# Patient Record
Sex: Female | Born: 1935 | Race: White | Hispanic: No | Smoking: Never smoker
Health system: Southern US, Community
[De-identification: ages and names within clinical notes are randomized; demographics above are authoritative.]

## PROBLEM LIST (undated history)

## (undated) DIAGNOSIS — J45909 Unspecified asthma, uncomplicated: Secondary | ICD-10-CM

## (undated) DIAGNOSIS — Z5189 Encounter for other specified aftercare: Secondary | ICD-10-CM

## (undated) DIAGNOSIS — E119 Type 2 diabetes mellitus without complications: Secondary | ICD-10-CM

## (undated) DIAGNOSIS — I10 Essential (primary) hypertension: Secondary | ICD-10-CM

## (undated) DIAGNOSIS — M1990 Unspecified osteoarthritis, unspecified site: Secondary | ICD-10-CM

## (undated) DIAGNOSIS — W19XXXA Unspecified fall, initial encounter: Secondary | ICD-10-CM

## (undated) DIAGNOSIS — C44300 Unspecified malignant neoplasm of skin of unspecified part of face: Secondary | ICD-10-CM

## (undated) DIAGNOSIS — Y92009 Unspecified place in unspecified non-institutional (private) residence as the place of occurrence of the external cause: Secondary | ICD-10-CM

## (undated) DIAGNOSIS — F419 Anxiety disorder, unspecified: Secondary | ICD-10-CM

## (undated) DIAGNOSIS — M109 Gout, unspecified: Secondary | ICD-10-CM

## (undated) HISTORY — PX: TONSILLECTOMY: SUR1361

## (undated) HISTORY — PX: BACK SURGERY: SHX140

## (undated) HISTORY — PX: ABDOMINAL HYSTERECTOMY: SHX81

## (undated) HISTORY — PX: ANKLE SURGERY: SHX546

## (undated) HISTORY — PX: SKIN BIOPSY: SHX1

## (undated) HISTORY — PX: CHOLECYSTECTOMY: SHX55

---

## 2009-02-15 ENCOUNTER — Ambulatory Visit (HOSPITAL_BASED_OUTPATIENT_CLINIC_OR_DEPARTMENT_OTHER): Admission: RE | Admit: 2009-02-15 | Discharge: 2009-02-15 | Payer: Self-pay | Admitting: Orthopedic Surgery

## 2009-09-08 ENCOUNTER — Emergency Department (HOSPITAL_COMMUNITY): Admission: EM | Admit: 2009-09-08 | Discharge: 2009-09-08 | Payer: Self-pay | Admitting: Emergency Medicine

## 2010-08-21 LAB — POCT I-STAT, CHEM 8
Calcium, Ion: 1.14 mmol/L (ref 1.12–1.32)
HCT: 45 % (ref 36.0–46.0)
Potassium: 4.4 mEq/L (ref 3.5–5.1)
TCO2: 29 mmol/L (ref 0–100)

## 2011-04-23 IMAGING — CR DG CHEST 1V PORT
1 series · 1 of 1 positions shown · non-contrast
Comparison: None

CLINICAL DATA: Preoperative respiratory examination.

PORTABLE CHEST - 1 VIEW

[view not recorded]
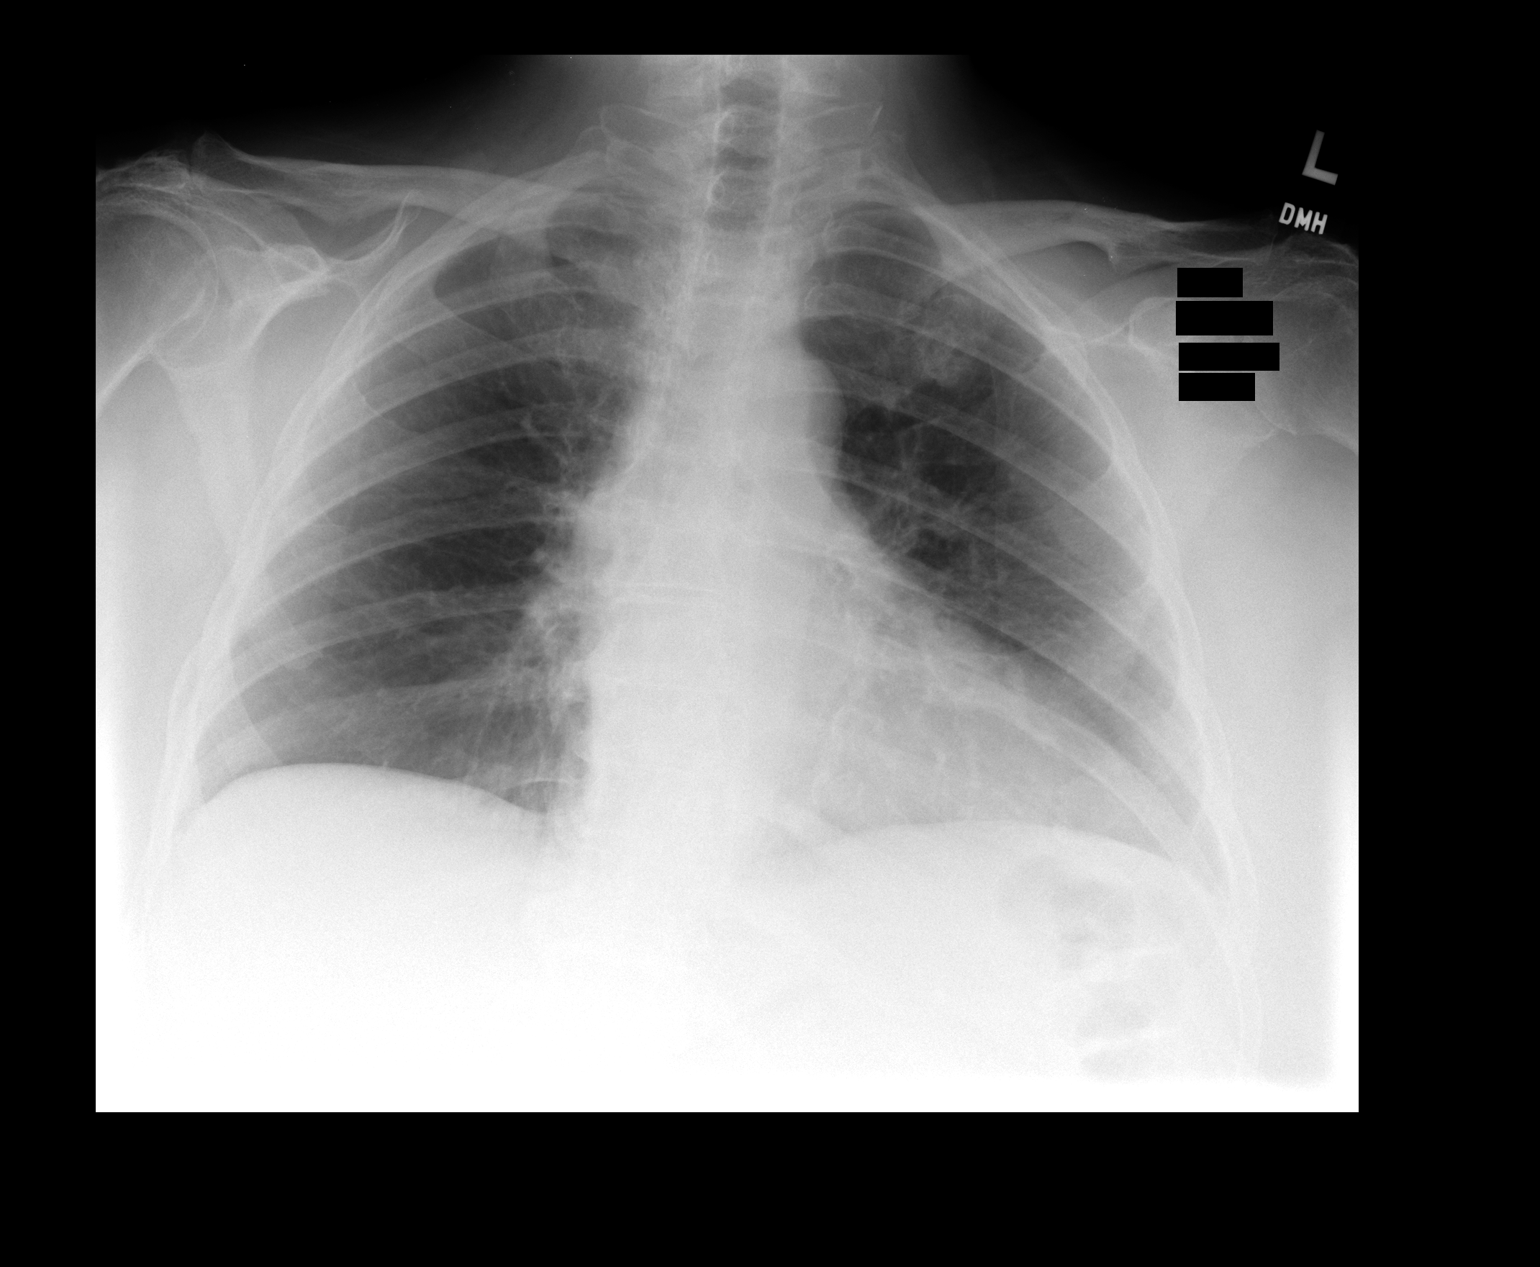

[1 of 1 positions shown; findings below may reference images not displayed]

FINDINGS: The cardiomediastinal silhouette is unremarkable.
The lungs are clear.
There is no evidence of focal airspace disease, pulmonary edema,
pleural effusion, or pneumothorax.
No acute bony abnormalities are identified.
IMPRESSION: No evidence of acute cardiopulmonary disease.

## 2014-10-22 ENCOUNTER — Other Ambulatory Visit: Payer: Self-pay | Admitting: Internal Medicine

## 2014-11-24 ENCOUNTER — Emergency Department (HOSPITAL_BASED_OUTPATIENT_CLINIC_OR_DEPARTMENT_OTHER)
Admission: EM | Admit: 2014-11-24 | Discharge: 2014-11-24 | Disposition: A | Discharge status: Home / Self Care | Payer: Medicare Other | Attending: Emergency Medicine | Admitting: Emergency Medicine

## 2014-11-24 ENCOUNTER — Encounter (HOSPITAL_BASED_OUTPATIENT_CLINIC_OR_DEPARTMENT_OTHER): Payer: Self-pay | Admitting: *Deleted

## 2014-11-24 ENCOUNTER — Emergency Department (HOSPITAL_BASED_OUTPATIENT_CLINIC_OR_DEPARTMENT_OTHER): Payer: Medicare Other

## 2014-11-24 DIAGNOSIS — Z85828 Personal history of other malignant neoplasm of skin: Secondary | ICD-10-CM | POA: Insufficient documentation

## 2014-11-24 DIAGNOSIS — M5412 Radiculopathy, cervical region: Secondary | ICD-10-CM | POA: Diagnosis not present

## 2014-11-24 DIAGNOSIS — I10 Essential (primary) hypertension: Secondary | ICD-10-CM | POA: Diagnosis not present

## 2014-11-24 DIAGNOSIS — Z79899 Other long term (current) drug therapy: Secondary | ICD-10-CM | POA: Diagnosis not present

## 2014-11-24 DIAGNOSIS — M25512 Pain in left shoulder: Secondary | ICD-10-CM | POA: Diagnosis not present

## 2014-11-24 DIAGNOSIS — E119 Type 2 diabetes mellitus without complications: Secondary | ICD-10-CM | POA: Diagnosis not present

## 2014-11-24 DIAGNOSIS — M4802 Spinal stenosis, cervical region: Secondary | ICD-10-CM | POA: Diagnosis not present

## 2014-11-24 DIAGNOSIS — M542 Cervicalgia: Secondary | ICD-10-CM | POA: Diagnosis present

## 2014-11-24 DIAGNOSIS — M109 Gout, unspecified: Secondary | ICD-10-CM | POA: Insufficient documentation

## 2014-11-24 DIAGNOSIS — J45909 Unspecified asthma, uncomplicated: Secondary | ICD-10-CM | POA: Diagnosis not present

## 2014-11-24 HISTORY — DX: Unspecified osteoarthritis, unspecified site: M19.90

## 2014-11-24 HISTORY — DX: Essential (primary) hypertension: I10

## 2014-11-24 HISTORY — DX: Encounter for other specified aftercare: Z51.89

## 2014-11-24 HISTORY — DX: Type 2 diabetes mellitus without complications: E11.9

## 2014-11-24 HISTORY — DX: Gout, unspecified: M10.9

## 2014-11-24 HISTORY — DX: Unspecified asthma, uncomplicated: J45.909

## 2014-11-24 HISTORY — DX: Unspecified malignant neoplasm of skin of unspecified part of face: C44.300

## 2014-11-24 MED ORDER — HYDROCODONE-ACETAMINOPHEN 5-325 MG PO TABS: 1.0000 | ORAL_TABLET | Freq: Four times a day (QID) | ORAL | Status: DC | PRN

## 2014-11-24 MED ORDER — PREDNISONE 10 MG PO TABS: 20.0000 mg | ORAL_TABLET | Freq: Two times a day (BID) | ORAL | Status: DC

## 2014-11-24 NOTE — ED Notes (Signed)
MD at bedside to discuss results of testing. 

## 2014-11-24 NOTE — ED Notes (Signed)
Patient transported to MRI via wheelchair per tech.

## 2014-11-24 NOTE — ED Notes (Signed)
MD at bedside. 

## 2014-11-24 NOTE — ED Provider Notes (Signed)
CSN: [id number]   Arrival date & time 11/24/14 1645  History  This chart was scribed for  Veryl Speak, MD by Altamease Oiler, ED Scribe. This patient was seen in room MH07/MH07 and the patient's care was started at 5:34 PM.  Chief Complaint  Patient presents with  . Shoulder Pain    HPI Patient is a 81 y.o. female presenting with neck injury. The history is provided by the patient and a relative. No language interpreter was used.  Neck Injury This is a new problem. The current episode started more than 2 days ago. The problem occurs constantly. The problem has been gradually worsening. Pertinent negatives include no chest pain, no abdominal pain, no headaches and no shortness of breath. Exacerbated by: laying flat. The symptoms are relieved by heat and medications. She has tried water for the symptoms. The treatment provided moderate relief.   Jo Hayes is a 81 y.o. female with PMHx of arthritis, gout, HTN, and DM who presents to the Emergency Department complaining of constant left-sided neck pain with onset 1 week ago. The pain radiates to the left shoulder, fingertips of the left hand, and back. She rates the pain 10/10 in severity and it is exacerbated by laying flat. The pain is improved with hot water and OTC cream. She associates the pain with a fall 2-3 months ago and notes that she has been seen in the ED and told that she was having muscle spasms.  Associated symptoms include feeling of "something crawling" in the left hand 1 month. Denies increased extremity weakness, numbness, and chest pain.    Past Medical History  Diagnosis Date  . Arthritis   . Blood transfusion without reported diagnosis   . Asthma   . Skin cancer of face   . Hypertension   . Diabetes mellitus without complication   . Gout     Past Surgical History  Procedure Laterality Date  . Abdominal hysterectomy    . Cholecystectomy    . Ankle surgery    . Tonsillectomy    . Skin biopsy      No family history  on file.  History  Substance Use Topics  . Smoking status: Never Smoker   . Smokeless tobacco: Never Used  . Alcohol Use: No     Review of Systems  Respiratory: Negative for shortness of breath.   Cardiovascular: Negative for chest pain.  Gastrointestinal: Negative for abdominal pain.  Musculoskeletal: Positive for neck pain.       Left shoulder pain   Neurological: Negative for headaches.       "Crawling" sensation in the left hand  All other systems reviewed and are negative.   Home Medications   Prior to Admission medications   Medication Sig Start Date End Date Taking? Authorizing Provider  allopurinol (ZYLOPRIM) 100 MG tablet Take 100 mg by mouth daily.   Yes Historical Provider, MD  amLODipine (NORVASC) 5 MG tablet Take 5 mg by mouth daily.   Yes Historical Provider, MD  atenolol (TENORMIN) 50 MG tablet Take 50 mg by mouth 2 (two) times daily.   Yes Historical Provider, MD  Calcium Carbonate-Vitamin D (CALCIUM + D PO) Take by mouth.   Yes Historical Provider, MD  clonazePAM (KLONOPIN) 1 MG tablet Take 1 mg by mouth 3 (three) times daily as needed for anxiety.   Yes Historical Provider, MD  Multiple Vitamin (MULTIVITAMIN) capsule Take 1 capsule by mouth daily.   Yes Historical Provider, MD    Allergies  Avelox; Demerol; Hydrochlorothiazide; and Sulfa antibiotics  Triage Vitals: BP 158/70 mmHg  Pulse 68  Temp(Src) 98.1 F (36.7 C) (Oral)  Resp 18  Ht 4\' 11"  (1.499 m)  Wt 180 lb (81.647 kg)  BMI 36.34 kg/m2  SpO2 96%  Physical Exam  Constitutional: She is oriented to person, place, and time and well-developed, well-nourished, and in no distress. No distress.  HENT:  Head: Normocephalic and atraumatic.  Neck: Normal range of motion. Neck supple.  There is TTP in the soft tissues of the left posterior cervical region.   Neurological: She is alert and oriented to person, place, and time.  The left arm is NVI. Strength is 5/5 throughout the hand. Sensation is intact  through the entire hand. Ulnar and radial pulses are easily palpable.   Skin: Skin is warm. She is not diaphoretic.    ED Course  Procedures   DIAGNOSTIC STUDIES: Oxygen Saturation is 96% on RA, normal by my interpretation.    COORDINATION OF CARE: 5:39 PM Discussed treatment plan which includes MR cervical spine with pt at bedside and pt agreed to plan.  7:26 PM-Consult complete with Dr. Christella Noa (neurosurgery). Patient case explained and discussed. Call ended at 7:28 PM.    Labs Review- Labs Reviewed - No data to display  Imaging Review Mr Cervical Spine Wo Contrast  11/24/2014   CLINICAL DATA:  Neck pain.  Left shoulder and arm pain  EXAM: MRI CERVICAL SPINE WITHOUT CONTRAST  TECHNIQUE: Multiplanar, multisequence MR imaging of the cervical spine was performed. No intravenous contrast was administered.  COMPARISON:  None.  FINDINGS: Negative for cervical spine fracture or mass. Mild chronic fracture superior endplate of T2. Anterior slip T2-3 with spondylosis.  Cervical cord signal is normal. Cervical medullary junction appears normal  C2-3: Small central disc protrusion. Cord flattening with mild spinal stenosis.  C3-4: Small central disc protrusion. Mild cord flattening and mild spinal stenosis  C4-5: Moderately large central and left-sided disc protrusion indenting the cord with mild to moderate spinal stenosis. Bilateral facet hypertrophy. Mild foraminal narrowing bilaterally  C5-6: Disc degeneration and spondylosis. Bilateral facet hypertrophy. Mild spinal stenosis and mild foraminal stenosis bilaterally.  C6-7: Moderate disc degeneration and spondylosis. Bilateral facet hypertrophy. Mild spinal stenosis and mild left foraminal stenosis.  C7-T1: Mild disc degeneration and spondylosis. Mild facet degeneration.  IMPRESSION: Mild chronic fracture of T2.  No acute cervical spine fracture.  Multilevel cervical degenerative change. Multiple areas of spinal stenosis, most prominent at C4-5 with a  moderately large central disc protrusion causing cord flattening and mild to moderate spinal stenosis.   Electronically Signed   By: Franchot Gallo M.D.   On: 11/24/2014 18:57    EKG Interpretation None     MDM   Final diagnoses:  Cervical radiculopathy     Patient is an 81 year old female who presents with a one-week history of pain in her neck which is now radiating down to her shoulder, elbow, and into her left hand. She is having no weakness and no bowel or bladder issues. An MRI was obtained which revealed spinal stenosis and multilevel disc issues. I've discussed these findings with Dr. Cyndy Freeze from neurosurgery who recommends follow-up in the office this week.   I personally performed the services described in this documentation, which was scribed in my presence. The recorded information has been reviewed and is accurate.     Veryl Speak, MD 11/24/14 [phone]

## 2014-11-24 NOTE — ED Notes (Signed)
Pt reports nerve pain in left hand at first of the week- now has pain in shoulder and elbow- denies falls and injury

## 2014-11-24 NOTE — Discharge Instructions (Signed)
Prednisone as prescribed.  Hydrocodone as prescribed as needed for pain.  Follow-up with Dr. Cyndy Freeze in the neurosurgery clinic. His contact information has been provided in this discharge summary. Call on Monday to arrange this appointment.   Cervical Radiculopathy Cervical radiculopathy happens when a nerve in the neck is pinched or bruised by a slipped (herniated) disk or by arthritic changes in the bones of the cervical spine. This can occur due to an injury or as part of the normal aging process. Pressure on the cervical nerves can cause pain or numbness that runs from your neck all the way down into your arm and fingers. CAUSES  There are many possible causes, including:  Injury.  Muscle tightness in the neck from overuse.  Swollen, painful joints (arthritis).  Breakdown or degeneration in the bones and joints of the spine (spondylosis) due to aging.  Bone spurs that may develop near the cervical nerves. SYMPTOMS  Symptoms include pain, weakness, or numbness in the affected arm and hand. Pain can be severe or irritating. Symptoms may be worse when extending or turning the neck. DIAGNOSIS  Your caregiver will ask about your symptoms and do a physical exam. He or she may test your strength and reflexes. X-rays, CT scans, and MRI scans may be needed in cases of injury or if the symptoms do not go away after a period of time. Electromyography (EMG) or nerve conduction testing may be done to study how your nerves and muscles are working. TREATMENT  Your caregiver may recommend certain exercises to help relieve your symptoms. Cervical radiculopathy can, and often does, get better with time and treatment. If your problems continue, treatment options may include:  Wearing a soft collar for short periods of time.  Physical therapy to strengthen the neck muscles.  Medicines, such as nonsteroidal anti-inflammatory drugs (NSAIDs), oral corticosteroids, or spinal injections.  Surgery.  Different types of surgery may be done depending on the cause of your problems. HOME CARE INSTRUCTIONS   Put ice on the affected area.  Put ice in a plastic bag.  Place a towel between your skin and the bag.  Leave the ice on for 15-20 minutes, 03-04 times a day or as directed by your caregiver.  If ice does not help, you can try using heat. Take a warm shower or bath, or use a hot water bottle as directed by your caregiver.  You may try a gentle neck and shoulder massage.  Use a flat pillow when you sleep.  Only take over-the-counter or prescription medicines for pain, discomfort, or fever as directed by your caregiver.  If physical therapy was prescribed, follow your caregiver's directions.  If a soft collar was prescribed, use it as directed. SEEK IMMEDIATE MEDICAL CARE IF:   Your pain gets much worse and cannot be controlled with medicines.  You have weakness or numbness in your hand, arm, face, or leg.  You have a high fever or a stiff, rigid neck.  You lose bowel or bladder control (incontinence).  You have trouble with walking, balance, or speaking. MAKE SURE YOU:   Understand these instructions.  Will watch your condition.  Will get help right away if you are not doing well or get worse. Document Released: 01/27/2001 Document Revised: 07/27/2011 Document Reviewed: 12/16/2010 [facility] Patient Information 2015 [place], [place]. This information is not intended to replace advice given to you by your health care provider. Make sure you discuss any questions you have with your health care provider.

## 2014-12-03 ENCOUNTER — Other Ambulatory Visit: Payer: Self-pay | Admitting: Neurosurgery

## 2014-12-05 ENCOUNTER — Encounter (HOSPITAL_COMMUNITY): Payer: Self-pay | Admitting: Pharmacy Technician

## 2014-12-11 ENCOUNTER — Encounter (HOSPITAL_COMMUNITY)
Admission: RE | Admit: 2014-12-11 | Discharge: 2014-12-11 | Disposition: A | Discharge status: Home / Self Care | Payer: Medicare Other | Source: Ambulatory Visit | Attending: Neurosurgery | Admitting: Neurosurgery

## 2014-12-11 ENCOUNTER — Encounter (HOSPITAL_COMMUNITY): Payer: Self-pay

## 2014-12-11 HISTORY — DX: Unspecified place in unspecified non-institutional (private) residence as the place of occurrence of the external cause: Y92.009

## 2014-12-11 HISTORY — DX: Unspecified place in unspecified non-institutional (private) residence as the place of occurrence of the external cause: W19.XXXA

## 2014-12-11 HISTORY — DX: Anxiety disorder, unspecified: F41.9

## 2014-12-11 LAB — BASIC METABOLIC PANEL
ANION GAP: 10 (ref 5–15)
BUN: 15 mg/dL (ref 6–20)
CALCIUM: 9.4 mg/dL (ref 8.9–10.3)
CO2: 26 mmol/L (ref 22–32)
Chloride: 98 mmol/L — ABNORMAL LOW (ref 101–111)
Creatinine, Ser: 0.83 mg/dL (ref 0.44–1.00)
GFR calc Af Amer: >60 mL/min (ref >60)
GFR calc non Af Amer: >60 mL/min (ref >60)
Glucose, Bld: 83 mg/dL (ref 65–99)
POTASSIUM: 4 mmol/L (ref 3.5–5.1)
Sodium: 134 mmol/L — ABNORMAL LOW (ref 135–145)

## 2014-12-11 LAB — CBC
HEMATOCRIT: 40.1 % (ref 36.0–46.0)
HEMOGLOBIN: 13.9 g/dL (ref 12.0–15.0)
MCH: 32.9 pg (ref 26.0–34.0)
MCHC: 34.7 g/dL (ref 30.0–36.0)
MCV: 95 fL (ref 78.0–100.0)
PLATELETS: 176 10*3/uL (ref 150–400)
RBC: 4.22 MIL/uL (ref 3.87–5.11)
RDW: 12.8 % (ref 11.5–15.5)
WBC: 8.9 10*3/uL (ref 4.0–10.5)

## 2014-12-11 LAB — SURGICAL PCR SCREEN
MRSA, PCR: NEGATIVE
STAPHYLOCOCCUS AUREUS: POSITIVE — AB

## 2014-12-11 LAB — GLUCOSE, CAPILLARY: GLUCOSE-CAPILLARY: 90 mg/dL (ref 65–99)

## 2014-12-11 NOTE — Progress Notes (Signed)
I called a prescription for Mupirocin ointment to [facility].

## 2014-12-11 NOTE — Pre-Procedure Instructions (Signed)
    Jo Hayes  12/11/2014      [place], [place] - [address] [place] [address] Phone: [phone] Fax: [phone]    Your procedure is scheduled on 12/14/14.  Report to [facility] Admitting at 730 A.M.  Call this number if you have problems the morning of surgery:  [phone]   Remember:  Do not eat food or drink liquids after midnight.  Take these medicines the morning of surgery with A SIP OF WATER --all inhalers,allopurinol,norvasc,atenolol,flexeral,hydrocodone   Do not wear jewelry, make-up or nail polish.  Do not wear lotions, powders, or perfumes.  You may wear deodorant.  Do not shave 48 hours prior to surgery.  Men may shave face and neck.  Do not bring valuables to the hospital.  [facility] is not responsible for any belongings or valuables.  Contacts, dentures or bridgework may not be worn into surgery.  Leave your suitcase in the car.  After surgery it may be brought to your room.  For patients admitted to the hospital, discharge time will be determined by your treatment team.  Patients discharged the day of surgery will not be allowed to drive home.   Name and phone number of your driver:    Special instructions:   Please read over the following fact sheets that you were given. Pain Booklet, Coughing and Deep Breathing, MRSA Information and Surgical Site Infection Prevention

## 2014-12-12 LAB — HEMOGLOBIN A1C
HEMOGLOBIN A1C: 6.3 % — AB (ref 4.8–5.6)
MEAN PLASMA GLUCOSE: 134 mg/dL

## 2014-12-13 MED ORDER — CEFAZOLIN SODIUM-DEXTROSE 2-3 GM-% IV SOLR
2.0000 g | INTRAVENOUS | Status: AC
  Administered 2014-12-14: 2 g via INTRAVENOUS
  Filled 2014-12-13: qty 50

## 2014-12-14 ENCOUNTER — Encounter (HOSPITAL_COMMUNITY)
Admission: AD | Disposition: A | Discharge status: Home / Self Care | Payer: Self-pay | Source: Ambulatory Visit | Attending: Neurosurgery

## 2014-12-14 ENCOUNTER — Inpatient Hospital Stay (HOSPITAL_COMMUNITY)
Admission: AD | Admit: 2014-12-14 | Discharge: 2014-12-16 | DRG: 473 | Disposition: A | Discharge status: Home / Self Care | Payer: Medicare Other | Source: Ambulatory Visit | Attending: Neurosurgery | Admitting: Neurosurgery

## 2014-12-14 ENCOUNTER — Encounter (HOSPITAL_COMMUNITY): Payer: Self-pay | Admitting: Certified Registered Nurse Anesthetist

## 2014-12-14 ENCOUNTER — Ambulatory Visit (HOSPITAL_COMMUNITY): Payer: Medicare Other

## 2014-12-14 ENCOUNTER — Ambulatory Visit (HOSPITAL_COMMUNITY): Payer: Medicare Other | Admitting: Certified Registered Nurse Anesthetist

## 2014-12-14 DIAGNOSIS — Z419 Encounter for procedure for purposes other than remedying health state, unspecified: Secondary | ICD-10-CM

## 2014-12-14 DIAGNOSIS — Z981 Arthrodesis status: Secondary | ICD-10-CM | POA: Diagnosis not present

## 2014-12-14 DIAGNOSIS — Z85828 Personal history of other malignant neoplasm of skin: Secondary | ICD-10-CM

## 2014-12-14 DIAGNOSIS — E119 Type 2 diabetes mellitus without complications: Secondary | ICD-10-CM | POA: Diagnosis present

## 2014-12-14 DIAGNOSIS — I10 Essential (primary) hypertension: Secondary | ICD-10-CM | POA: Diagnosis present

## 2014-12-14 DIAGNOSIS — Z882 Allergy status to sulfonamides status: Secondary | ICD-10-CM

## 2014-12-14 DIAGNOSIS — M5022 Other cervical disc displacement, mid-cervical region: Secondary | ICD-10-CM | POA: Diagnosis present

## 2014-12-14 DIAGNOSIS — Z888 Allergy status to other drugs, medicaments and biological substances status: Secondary | ICD-10-CM

## 2014-12-14 DIAGNOSIS — M5020 Other cervical disc displacement, unspecified cervical region: Secondary | ICD-10-CM | POA: Diagnosis present

## 2014-12-14 DIAGNOSIS — M4802 Spinal stenosis, cervical region: Secondary | ICD-10-CM | POA: Diagnosis present

## 2014-12-14 HISTORY — PX: ANTERIOR CERVICAL DECOMP/DISCECTOMY FUSION: SHX1161

## 2014-12-14 LAB — GLUCOSE, CAPILLARY: Glucose-Capillary: 94 mg/dL (ref 65–99)

## 2014-12-14 SURGERY — ANTERIOR CERVICAL DECOMPRESSION/DISCECTOMY FUSION 1 LEVEL
Anesthesia: General

## 2014-12-14 MED ORDER — LIDOCAINE HCL (CARDIAC) 20 MG/ML IV SOLN
INTRAVENOUS | Status: AC
  Filled 2014-12-14: qty 5

## 2014-12-14 MED ORDER — ONDANSETRON HCL 4 MG/2ML IJ SOLN
INTRAMUSCULAR | Status: DC | PRN
  Administered 2014-12-14: 4 mg via INTRAVENOUS

## 2014-12-14 MED ORDER — CALCIUM CITRATE-VITAMIN D 315-200 MG-UNIT PO TABS: 1.0000 | ORAL_TABLET | Freq: Every day | ORAL | Status: DC

## 2014-12-14 MED ORDER — MULTIVITAMINS PO CAPS: 1.0000 | ORAL_CAPSULE | Freq: Every day | ORAL | Status: DC

## 2014-12-14 MED ORDER — PHENOL 1.4 % MT LIQD
1.0000 | OROMUCOSAL | Status: DC | PRN
  Filled 2014-12-14: qty 177

## 2014-12-14 MED ORDER — DOCUSATE SODIUM 100 MG PO CAPS
100.0000 mg | ORAL_CAPSULE | Freq: Two times a day (BID) | ORAL | Status: DC
  Administered 2014-12-14 – 2014-12-16 (×4): 100 mg via ORAL
  Filled 2014-12-14 (×4): qty 1

## 2014-12-14 MED ORDER — NEOSTIGMINE METHYLSULFATE 10 MG/10ML IV SOLN
INTRAVENOUS | Status: DC | PRN
  Administered 2014-12-14: 4 mg via INTRAMUSCULAR

## 2014-12-14 MED ORDER — PROMETHAZINE HCL 25 MG/ML IJ SOLN
6.2500 mg | INTRAMUSCULAR | Status: DC | PRN
Start: 2014-12-14 — End: 2014-12-14

## 2014-12-14 MED ORDER — POLYVINYL ALCOHOL 1.4 % OP SOLN
1.0000 [drp] | OPHTHALMIC | Status: DC | PRN
  Filled 2014-12-14: qty 15

## 2014-12-14 MED ORDER — MORPHINE SULFATE 2 MG/ML IJ SOLN
INTRAMUSCULAR | Status: AC
  Filled 2014-12-14: qty 1

## 2014-12-14 MED ORDER — CALCIUM CARBONATE-VITAMIN D 500-200 MG-UNIT PO TABS
1.0000 | ORAL_TABLET | Freq: Every day | ORAL | Status: DC
  Administered 2014-12-15 – 2014-12-16 (×2): 1 via ORAL
  Filled 2014-12-14 (×2): qty 1

## 2014-12-14 MED ORDER — 0.9 % SODIUM CHLORIDE (POUR BTL) OPTIME
TOPICAL | Status: DC | PRN
  Administered 2014-12-14: 1000 mL

## 2014-12-14 MED ORDER — SODIUM CHLORIDE 0.9 % IJ SOLN: 3.0000 mL | INTRAMUSCULAR | Status: DC | PRN

## 2014-12-14 MED ORDER — CEFAZOLIN SODIUM 1-5 GM-% IV SOLN
1.0000 g | Freq: Three times a day (TID) | INTRAVENOUS | Status: AC
  Administered 2014-12-14 – 2014-12-15 (×2): 1 g via INTRAVENOUS
  Filled 2014-12-14 (×2): qty 50

## 2014-12-14 MED ORDER — ADULT MULTIVITAMIN W/MINERALS CH
1.0000 | ORAL_TABLET | Freq: Every day | ORAL | Status: DC
  Administered 2014-12-15 – 2014-12-16 (×2): 1 via ORAL
  Filled 2014-12-14 (×2): qty 1

## 2014-12-14 MED ORDER — PROPOFOL 10 MG/ML IV BOLUS
INTRAVENOUS | Status: DC | PRN
  Administered 2014-12-14: 110 mg via INTRAVENOUS

## 2014-12-14 MED ORDER — ACETAMINOPHEN 325 MG PO TABS: 650.0000 mg | ORAL_TABLET | ORAL | Status: DC | PRN

## 2014-12-14 MED ORDER — GLYCOPYRROLATE 0.2 MG/ML IJ SOLN
INTRAMUSCULAR | Status: DC | PRN
  Administered 2014-12-14: .6 mg via INTRAVENOUS

## 2014-12-14 MED ORDER — ALLOPURINOL 100 MG PO TABS
100.0000 mg | ORAL_TABLET | Freq: Every day | ORAL | Status: DC
  Administered 2014-12-15 – 2014-12-16 (×2): 100 mg via ORAL
  Filled 2014-12-14 (×2): qty 1

## 2014-12-14 MED ORDER — IRBESARTAN 75 MG PO TABS
37.5000 mg | ORAL_TABLET | Freq: Every day | ORAL | Status: DC
  Administered 2014-12-15: 37.5 mg via ORAL
  Filled 2014-12-14 (×3): qty 0.5

## 2014-12-14 MED ORDER — POTASSIUM CHLORIDE IN NACL 20-0.9 MEQ/L-% IV SOLN: INTRAVENOUS | Status: DC

## 2014-12-14 MED ORDER — LIDOCAINE-EPINEPHRINE 0.5 %-1:200000 IJ SOLN
INTRAMUSCULAR | Status: DC | PRN
  Administered 2014-12-14: 4 mL

## 2014-12-14 MED ORDER — LIDOCAINE HCL (CARDIAC) 20 MG/ML IV SOLN
INTRAVENOUS | Status: DC | PRN
  Administered 2014-12-14: 80 mg via INTRAVENOUS

## 2014-12-14 MED ORDER — FENTANYL CITRATE (PF) 250 MCG/5ML IJ SOLN
INTRAMUSCULAR | Status: AC
  Filled 2014-12-14: qty 5

## 2014-12-14 MED ORDER — HYPROMELLOSE (GONIOSCOPIC) 2.5 % OP SOLN: 1.0000 [drp] | Freq: Every day | OPHTHALMIC | Status: DC | PRN

## 2014-12-14 MED ORDER — LIDOCAINE HCL 4 % MT SOLN
OROMUCOSAL | Status: DC | PRN
  Administered 2014-12-14: 4 mL via TOPICAL

## 2014-12-14 MED ORDER — HEMOSTATIC AGENTS (NO CHARGE) OPTIME
TOPICAL | Status: DC | PRN
  Administered 2014-12-14: 1 via TOPICAL

## 2014-12-14 MED ORDER — ALBUTEROL SULFATE (2.5 MG/3ML) 0.083% IN NEBU: 3.0000 mL | INHALATION_SOLUTION | Freq: Four times a day (QID) | RESPIRATORY_TRACT | Status: DC | PRN

## 2014-12-14 MED ORDER — SODIUM CHLORIDE 0.9 % IJ SOLN
3.0000 mL | Freq: Two times a day (BID) | INTRAMUSCULAR | Status: DC
  Administered 2014-12-14 – 2014-12-16 (×3): 3 mL via INTRAVENOUS

## 2014-12-14 MED ORDER — ROCURONIUM BROMIDE 100 MG/10ML IV SOLN
INTRAVENOUS | Status: DC | PRN
  Administered 2014-12-14: 35 mg via INTRAVENOUS

## 2014-12-14 MED ORDER — FENTANYL CITRATE (PF) 100 MCG/2ML IJ SOLN
INTRAMUSCULAR | Status: DC | PRN
  Administered 2014-12-14: 100 ug via INTRAVENOUS
  Administered 2014-12-14: 25 ug via INTRAVENOUS

## 2014-12-14 MED ORDER — DEXTROSE 5 % IV SOLN
10.0000 mg | INTRAVENOUS | Status: DC | PRN
  Administered 2014-12-14: 20 ug/min via INTRAVENOUS
  Administered 2014-12-14: 40 ug/min via INTRAVENOUS

## 2014-12-14 MED ORDER — SODIUM CHLORIDE 0.9 % IV SOLN: 250.0000 mL | INTRAVENOUS | Status: DC

## 2014-12-14 MED ORDER — ACETAMINOPHEN 650 MG RE SUPP: 650.0000 mg | RECTAL | Status: DC | PRN

## 2014-12-14 MED ORDER — BISACODYL 5 MG PO TBEC: 5.0000 mg | DELAYED_RELEASE_TABLET | Freq: Every day | ORAL | Status: DC | PRN

## 2014-12-14 MED ORDER — MENTHOL 3 MG MT LOZG
1.0000 | LOZENGE | OROMUCOSAL | Status: DC | PRN
  Administered 2014-12-15: 3 mg via ORAL
  Filled 2014-12-14 (×2): qty 9

## 2014-12-14 MED ORDER — CYCLOBENZAPRINE HCL 10 MG PO TABS
10.0000 mg | ORAL_TABLET | Freq: Three times a day (TID) | ORAL | Status: DC | PRN
  Administered 2014-12-15 – 2014-12-16 (×2): 10 mg via ORAL
  Filled 2014-12-14 (×2): qty 1

## 2014-12-14 MED ORDER — AMLODIPINE BESYLATE 5 MG PO TABS
5.0000 mg | ORAL_TABLET | Freq: Every day | ORAL | Status: DC
  Administered 2014-12-15 – 2014-12-16 (×2): 5 mg via ORAL
  Filled 2014-12-14 (×2): qty 1

## 2014-12-14 MED ORDER — HYDROCODONE-ACETAMINOPHEN 5-325 MG PO TABS
1.0000 | ORAL_TABLET | ORAL | Status: DC | PRN
  Administered 2014-12-14 – 2014-12-16 (×5): 2 via ORAL
  Filled 2014-12-14 (×5): qty 2

## 2014-12-14 MED ORDER — SENNOSIDES-DOCUSATE SODIUM 8.6-50 MG PO TABS: 1.0000 | ORAL_TABLET | Freq: Every evening | ORAL | Status: DC | PRN

## 2014-12-14 MED ORDER — MORPHINE SULFATE 2 MG/ML IJ SOLN
1.0000 mg | INTRAMUSCULAR | Status: DC | PRN
  Administered 2014-12-14 (×3): 1 mg via INTRAVENOUS

## 2014-12-14 MED ORDER — LORATADINE 10 MG PO TABS
10.0000 mg | ORAL_TABLET | Freq: Every day | ORAL | Status: DC
  Administered 2014-12-14 – 2014-12-16 (×3): 10 mg via ORAL
  Filled 2014-12-14 (×3): qty 1

## 2014-12-14 MED ORDER — CLONAZEPAM 1 MG PO TABS: 1.0000 mg | ORAL_TABLET | Freq: Every day | ORAL | Status: DC

## 2014-12-14 MED ORDER — GLYCOPYRROLATE 0.2 MG/ML IJ SOLN
INTRAMUSCULAR | Status: AC
  Filled 2014-12-14: qty 3

## 2014-12-14 MED ORDER — MORPHINE SULFATE 2 MG/ML IJ SOLN: 1.0000 mg | INTRAMUSCULAR | Status: DC | PRN

## 2014-12-14 MED ORDER — THROMBIN 5000 UNITS EX SOLR
CUTANEOUS | Status: DC | PRN
  Administered 2014-12-14 (×2): 5000 [IU] via TOPICAL

## 2014-12-14 MED ORDER — LACTATED RINGERS IV SOLN
INTRAVENOUS | Status: DC
  Administered 2014-12-14: 12:00:00 via INTRAVENOUS

## 2014-12-14 MED ORDER — ONDANSETRON HCL 4 MG/2ML IJ SOLN: 4.0000 mg | INTRAMUSCULAR | Status: DC | PRN

## 2014-12-14 MED ORDER — ATENOLOL 25 MG PO TABS
50.0000 mg | ORAL_TABLET | Freq: Two times a day (BID) | ORAL | Status: DC
  Administered 2014-12-15 – 2014-12-16 (×3): 50 mg via ORAL
  Filled 2014-12-14 (×3): qty 2

## 2014-12-14 MED ORDER — NEOSTIGMINE METHYLSULFATE 10 MG/10ML IV SOLN: INTRAVENOUS | Status: DC | PRN

## 2014-12-14 SURGICAL SUPPLY — 70 items
BIT DRILL NEURO 2X3.1 SFT TUCH (MISCELLANEOUS) ×2 IMPLANT
BLADE CLIPPER SURG (BLADE) IMPLANT
BNDG GAUZE ELAST 4 BULKY (GAUZE/BANDAGES/DRESSINGS) IMPLANT
BUR DRUM 4.0 (BURR) IMPLANT
BUR DRUM 4.0MM (BURR)
CANISTER SUCT 3000ML PPV (MISCELLANEOUS) ×3 IMPLANT
CONT SPEC 4OZ CLIKSEAL STRL BL (MISCELLANEOUS) ×3 IMPLANT
DECANTER SPIKE VIAL GLASS SM (MISCELLANEOUS) ×3 IMPLANT
DRAPE LAPAROTOMY 100X72 PEDS (DRAPES) ×3 IMPLANT
DRAPE MICROSCOPE LEICA (MISCELLANEOUS) ×3 IMPLANT
DRAPE POUCH INSTRU U-SHP 10X18 (DRAPES) ×3 IMPLANT
DRAPE PROXIMA HALF (DRAPES) ×3 IMPLANT
DRILL NEURO 2X3.1 SOFT TOUCH (MISCELLANEOUS) ×6
DURAPREP 6ML APPLICATOR 50/CS (WOUND CARE) ×3 IMPLANT
ELECT COATED BLADE 2.86 ST (ELECTRODE) ×3 IMPLANT
ELECT REM PT RETURN 9FT ADLT (ELECTROSURGICAL) ×3
ELECTRODE REM PT RTRN 9FT ADLT (ELECTROSURGICAL) ×1 IMPLANT
GAUZE SPONGE 4X4 16PLY XRAY LF (GAUZE/BANDAGES/DRESSINGS) IMPLANT
GLOVE BIO SURGEON STRL SZ 6.5 (GLOVE) IMPLANT
GLOVE BIO SURGEON STRL SZ7 (GLOVE) IMPLANT
GLOVE BIO SURGEON STRL SZ7.5 (GLOVE) IMPLANT
GLOVE BIO SURGEON STRL SZ8 (GLOVE) ×3 IMPLANT
GLOVE BIO SURGEON STRL SZ8.5 (GLOVE) IMPLANT
GLOVE BIO SURGEONS STRL SZ 6.5 (GLOVE)
GLOVE BIOGEL M 8.0 STRL (GLOVE) IMPLANT
GLOVE ECLIPSE 6.5 STRL STRAW (GLOVE) ×3 IMPLANT
GLOVE ECLIPSE 7.0 STRL STRAW (GLOVE) IMPLANT
GLOVE ECLIPSE 7.5 STRL STRAW (GLOVE) IMPLANT
GLOVE ECLIPSE 8.0 STRL XLNG CF (GLOVE) IMPLANT
GLOVE ECLIPSE 8.5 STRL (GLOVE) IMPLANT
GLOVE EXAM NITRILE LRG STRL (GLOVE) IMPLANT
GLOVE EXAM NITRILE MD LF STRL (GLOVE) IMPLANT
GLOVE EXAM NITRILE XL STR (GLOVE) IMPLANT
GLOVE EXAM NITRILE XS STR PU (GLOVE) IMPLANT
GLOVE INDICATOR 6.5 STRL GRN (GLOVE) IMPLANT
GLOVE INDICATOR 7.0 STRL GRN (GLOVE) IMPLANT
GLOVE INDICATOR 7.5 STRL GRN (GLOVE) ×3 IMPLANT
GLOVE INDICATOR 8.0 STRL GRN (GLOVE) IMPLANT
GLOVE INDICATOR 8.5 STRL (GLOVE) IMPLANT
GLOVE OPTIFIT SS 8.0 STRL (GLOVE) IMPLANT
GLOVE SURG SS PI 6.5 STRL IVOR (GLOVE) IMPLANT
GLOVE SURG SS PI 7.0 STRL IVOR (GLOVE) ×3 IMPLANT
GOWN STRL REUS W/ TWL LRG LVL3 (GOWN DISPOSABLE) ×2 IMPLANT
GOWN STRL REUS W/ TWL XL LVL3 (GOWN DISPOSABLE) ×1 IMPLANT
GOWN STRL REUS W/TWL 2XL LVL3 (GOWN DISPOSABLE) IMPLANT
GOWN STRL REUS W/TWL LRG LVL3 (GOWN DISPOSABLE) ×4
GOWN STRL REUS W/TWL XL LVL3 (GOWN DISPOSABLE) ×2
KIT BASIN OR (CUSTOM PROCEDURE TRAY) ×3 IMPLANT
KIT ROOM TURNOVER OR (KITS) ×3 IMPLANT
LIQUID BAND (GAUZE/BANDAGES/DRESSINGS) ×3 IMPLANT
NEEDLE HYPO 25X1 1.5 SAFETY (NEEDLE) ×3 IMPLANT
NEEDLE SPNL 22GX3.5 QUINCKE BK (NEEDLE) ×3 IMPLANT
NS IRRIG 1000ML POUR BTL (IV SOLUTION) ×3 IMPLANT
PACK LAMINECTOMY NEURO (CUSTOM PROCEDURE TRAY) ×3 IMPLANT
PAD ARMBOARD 7.5X6 YLW CONV (MISCELLANEOUS) ×9 IMPLANT
PIN DISTRACTION 14MM (PIN) ×9 IMPLANT
PLATE HELIX R 22MM (Plate) ×3 IMPLANT
RUBBERBAND STERILE (MISCELLANEOUS) ×6 IMPLANT
SCREW 4.0X13 (Screw) ×4 IMPLANT
SCREW 4.0X13MM (Screw) ×8 IMPLANT
SPACER PARALLEL 6MM CC ACF (Bone Implant) ×3 IMPLANT
SPONGE INTESTINAL PEANUT (DISPOSABLE) ×3 IMPLANT
SPONGE SURGIFOAM ABS GEL SZ50 (HEMOSTASIS) ×3 IMPLANT
SUT VIC AB 0 CT1 27 (SUTURE) ×2
SUT VIC AB 0 CT1 27XBRD ANTBC (SUTURE) ×1 IMPLANT
SUT VIC AB 3-0 SH 8-18 (SUTURE) ×6 IMPLANT
SYR 20ML ECCENTRIC (SYRINGE) ×3 IMPLANT
TOWEL OR 17X24 6PK STRL BLUE (TOWEL DISPOSABLE) ×3 IMPLANT
TOWEL OR 17X26 10 PK STRL BLUE (TOWEL DISPOSABLE) ×3 IMPLANT
WATER STERILE IRR 1000ML POUR (IV SOLUTION) ×3 IMPLANT

## 2014-12-14 NOTE — Anesthesia Postprocedure Evaluation (Signed)
  Anesthesia Post-op Note  Patient: Jo Hayes  Procedure(s) Performed: Procedure(s): ANTERIOR CERVICAL DECOMPRESSION/DISCECTOMY FUSION  CERVICALFOUR-FIVE WITH PLATING BONEGRAFT (N/A)  Patient Location: PACU  Anesthesia Type:General  Level of Consciousness: awake, alert  and sedated  Airway and Oxygen Therapy: Patient Spontanous Breathing  Post-op Pain: mild  Post-op Assessment: Post-op Vital signs reviewed              Post-op Vital Signs: stable  Last Vitals:  Filed Vitals:   12/14/14 1508  BP:   Pulse:   Temp: 36.4 C  Resp:     Complications: No apparent anesthesia complications
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
## 2014-12-14 NOTE — Anesthesia Preprocedure Evaluation (Addendum)
Anesthesia Evaluation  Patient identified by MRN, date of birth, ID band Patient awake    Reviewed: Allergy & Precautions, NPO status , Patient's Chart, lab work & pertinent test results  Airway Mallampati: II   Neck ROM: Full  Mouth opening: Limited Mouth Opening  Dental  (+) Edentulous Upper   Pulmonary asthma ,  breath sounds clear to auscultation        Cardiovascular hypertension, Rhythm:Regular Rate:Normal     Neuro/Psych negative neurological ROS     GI/Hepatic negative GI ROS, Neg liver ROS,   Endo/Other  diabetes  Renal/GU negative Renal ROS     Musculoskeletal  (+) Arthritis -,   Abdominal   Peds  Hematology negative hematology ROS (+)   Anesthesia Other Findings   Reproductive/Obstetrics                            Anesthesia Physical Anesthesia Plan  ASA: III  Anesthesia Plan: General   Post-op Pain Management:    Induction: Intravenous  Airway Management Planned: Oral ETT  Additional Equipment:   Intra-op Plan:   Post-operative Plan: Extubation in OR  Informed Consent: I have reviewed the patients History and Physical, chart, labs and discussed the procedure including the risks, benefits and alternatives for the proposed anesthesia with the patient or authorized representative who has indicated his/her understanding and acceptance.   Dental advisory given  Plan Discussed with: CRNA and Surgeon  Anesthesia Plan Comments:         Anesthesia Quick Evaluation

## 2014-12-14 NOTE — Transfer of Care (Signed)
Immediate Anesthesia Transfer of Care Note  Patient: Jo Hayes  Procedure(s) Performed: Procedure(s): ANTERIOR CERVICAL DECOMPRESSION/DISCECTOMY FUSION  CERVICALFOUR-FIVE WITH PLATING BONEGRAFT (N/A)  Patient Location: PACU  Anesthesia Type:General  Level of Consciousness: awake, alert , sedated, patient cooperative and responds to stimulation  Airway & Oxygen Therapy: Patient Spontanous Breathing and Patient connected to nasal cannula oxygen  Post-op Assessment: Report given to RN, Post -op Vital signs reviewed and stable and Patient moving all extremities X 4  Post vital signs: Reviewed and stable  Last Vitals:  Filed Vitals:   12/14/14 1126  BP: 163/46  Pulse: 62  Temp: 36.4 C  Resp: 18    Complications: No apparent anesthesia complications

## 2014-12-14 NOTE — Progress Notes (Signed)
Patient arrived around 1530, alert oriented X 4, pain was controled by medications given in PACU, incision open to air closed with dura bond,  SCD's applied and IV fluid set up. Family is at bed side, will continue to monitor.

## 2014-12-14 NOTE — Op Note (Signed)
12/14/2014  2:00 PM  PATIENT:  Jo Hayes  81 y.o. female with severe pain in the left upper extremity, specifically the shoulder. An MRI revealed a slightly  PRE-OPERATIVE DIAGNOSIS:  cervical herniated disc C4/5  POST-OPERATIVE DIAGNOSIS:  cervical herniated disc C4/5  PROCEDURE:  Anterior Cervical decompression C4/5 Arthrodesis C4/5 with 41mm structural allograft Anterior instrumentation(Nuvasive) C4/5  SURGEON:   Surgeon(s): Ashok Pall, MD Eustace Moore, MD   ASSISTANTS:jones, david  ANESTHESIA:   general  EBL:  Total I/O In: -  Out: 20 [Blood:20]  BLOOD ADMINISTERED:none  CELL SAVER GIVEN:none  COUNT:per nursing  DRAINS: none   SPECIMEN:  No Specimen  DICTATION: Ms. [name] was taken to the operating room, intubated, and placed under general anesthesia without difficulty. She was positioned supine with her head in slight extension on a horseshoe headrest. The neck was prepped and draped in a sterile manner. I infiltrated 4 cc's 1/2%lidocaine/1:200,000 strength epinephrine into the planned incision starting from the midline to the medial border of the left sternocleidomastoid muscle. I opened the incision with a 10 blade and dissected sharply through soft tissue to the platysma. I dissected in the plane superior to the platysma both rostrally and caudally. I then opened the platysma in a horizontal fashion with Metzenbaum scissors, and dissected in the inferior plane rostrally and caudally. With both blunt and sharp technique I created an avascular corridor to the cervical spine. I placed a spinal needle(s) in the disc space at 4/5 . I then reflected the longus colli from C4 to C5 and placed self retaining retractors. I opened the disc space(s) at 4/5 with a 15 blade. I removed disc with curettes, Kerrison punches, and the drill. Using the drill I removed osteophytes and prepared for the decompression.  I decompressed the spinal canal and the C5 root(s) with the drill,  Kerrison punches, and the curettes. I used the microscope to aid in microdissection. I removed the posterior longitudinal ligament to fully expose and decompress the thecal sac. I exposed the roots laterally taking down the 4/5i uncovertebral joints. With the decompression complete I moved on to the arthrodesis. I used the drill to level the surfaces of C4 and C5. I removed soft tissue to prepare the disc space and the bony surfaces. I measured the space and placed a 52mm structural allograft into the disc space.  We then placed the anterior instrumentation. I placed 2 screws in each vertebral body through the plate. I locked the screws into place. Intraoperative xray showed the graft, plate, and screws to be in good position. I irrigated the wound, achieved hemostasis, and closed the wound in layers. I approximated the platysma, and the subcuticular plane with vicryl sutures. I used Dermabond for a sterile dressing.   PLAN OF CARE: Admit to inpatient   PATIENT DISPOSITION:  PACU - hemodynamically stable.   Delay start of Pharmacological VTE agent (>24hrs) due to surgical blood loss or risk of bleeding:  yes

## 2014-12-14 NOTE — Anesthesia Procedure Notes (Signed)
Procedure Name: Intubation Date/Time: 12/14/2014 12:36 PM Performed by: Garrison Columbus T Pre-anesthesia Checklist: Emergency Drugs available, Suction available, Patient identified and Patient being monitored Patient Re-evaluated:Patient Re-evaluated prior to inductionOxygen Delivery Method: Circle system utilized Preoxygenation: Pre-oxygenation with 100% oxygen Intubation Type: IV induction Ventilation: Mask ventilation without difficulty Laryngoscope Size: Miller and 2 Grade View: Grade I Tube type: Oral Tube size: 7.5 mm Number of attempts: 1 Airway Equipment and Method: Stylet and LTA kit utilized Placement Confirmation: ETT inserted through vocal cords under direct vision,  positive ETCO2 and breath sounds checked- equal and bilateral Secured at: 22 cm Tube secured with: Tape Dental Injury: Teeth and Oropharynx as per pre-operative assessment  Comments: Intubation by Lavona Mound, SRNA

## 2014-12-15 NOTE — Progress Notes (Signed)
Subjective: Patient reports feeling better.  "My arm doesn't hurt at all."  Objective: Vital signs in last 24 hours: Temp:  [97.5 F (36.4 C)-97.7 F (36.5 C)] 97.5 F (36.4 C) (07/30 0542) Pulse Rate:  [60-78] 60 (07/30 0542) Resp:  [17-21] 18 (07/30 0542) BP: (118-164)/(46-73) 164/72 mmHg (07/30 0542) SpO2:  [94 %-100 %] 100 % (07/30 0542) Weight:  [71.668 kg (158 lb)-76.476 kg (168 lb 9.6 oz)] 76.476 kg (168 lb 9.6 oz) (07/29 1600)  Intake/Output from previous day: 07/29 0701 - 07/30 0700 In: 1003 [I.V.:1003] Out: 20 [Blood:20] Intake/Output this shift:    Physical Exam: Strength full Bilateral D/B/T.  MAEW. Dressing CDI.  Lab Results: No results for input(s): WBC, HGB, HCT, PLT in the last 72 hours. BMET No results for input(s): NA, K, CL, CO2, GLUCOSE, BUN, CREATININE, CALCIUM in the last 72 hours.  Studies/Results: Dg Cervical Spine Complete  12/14/2014   CLINICAL DATA:  C4-5 ACDF  EXAM: CERVICAL SPINE  4+ VIEWS  COMPARISON:  None.  FINDINGS: For lateral views of the cervical spine were obtained intraoperatively. Initial films demonstrate and instrument in the anterior soft tissues at C3-4. Subsequently it was repositioned at C4-5. The last film shows evidence of a fixation plate anteriorly with pedicle screws extending into C4 and C5.  IMPRESSION: Intraoperative localization for C4-5 fusion.   Electronically Signed   By: Inez Catalina M.D.   On: 12/14/2014 13:58    Assessment/Plan: Doing well.  D/C in AM if doing well.    LOS: 1 day    Peggyann Shoals, MD 12/15/2014, 7:21 AM

## 2014-12-16 MED ORDER — HYDROCODONE-ACETAMINOPHEN 5-325 MG PO TABS: 1.0000 | ORAL_TABLET | Freq: Four times a day (QID) | ORAL | Status: DC | PRN

## 2014-12-16 NOTE — Progress Notes (Signed)
D/C orders received, pt for D/C home today.  IV and telemetry D/C.  Rx and D/C instructions given with verbalized understanding.  Family at bedside to assist with D/C.  Staff brought pt downstairs via wheelchair.  

## 2014-12-16 NOTE — Progress Notes (Signed)
Utilization Review Completed.Kaitlin Ardito T7/31/2016  

## 2014-12-16 NOTE — Discharge Summary (Signed)
Physician Discharge Summary  Patient ID: Jo Hayes MRN: [id number] DOB/AGE: July 18, 1927 81 y.o.  Admit date: 12/14/2014 Discharge date: 12/16/2014  Admission Diagnoses: Cervical stenosis   Discharge Diagnoses: Same   Discharged Condition: good  Hospital Course: The patient was admitted on 12/14/2014 and taken to the operating room where the patient underwent ACDF. The patient tolerated the procedure well and was taken to the recovery room and then to the floor in stable condition. The hospital course was routine. There were no complications. The wound remained clean dry and intact. Pt had appropriate neck soreness. No complaints of arm pain or new N/T/W. The patient remained afebrile with stable vital signs, and tolerated a regular diet. The patient continued to increase activities, and pain was well controlled with oral pain medications.   Consults: None  Significant Diagnostic Studies:  Results for orders placed or performed during the hospital encounter of 12/14/14  Glucose, capillary  Result Value Ref Range   Glucose-Capillary 94 65 - 99 mg/dL    Dg Cervical Spine Complete  12/14/2014   CLINICAL DATA:  C4-5 ACDF  EXAM: CERVICAL SPINE  4+ VIEWS  COMPARISON:  None.  FINDINGS: For lateral views of the cervical spine were obtained intraoperatively. Initial films demonstrate and instrument in the anterior soft tissues at C3-4. Subsequently it was repositioned at C4-5. The last film shows evidence of a fixation plate anteriorly with pedicle screws extending into C4 and C5.  IMPRESSION: Intraoperative localization for C4-5 fusion.   Electronically Signed   By: Inez Catalina M.D.   On: 12/14/2014 13:58   Mr Cervical Spine Wo Contrast  11/24/2014   CLINICAL DATA:  Neck pain.  Left shoulder and arm pain  EXAM: MRI CERVICAL SPINE WITHOUT CONTRAST  TECHNIQUE: Multiplanar, multisequence MR imaging of the cervical spine was performed. No intravenous contrast was administered.  COMPARISON:  None.   FINDINGS: Negative for cervical spine fracture or mass. Mild chronic fracture superior endplate of T2. Anterior slip T2-3 with spondylosis.  Cervical cord signal is normal. Cervical medullary junction appears normal  C2-3: Small central disc protrusion. Cord flattening with mild spinal stenosis.  C3-4: Small central disc protrusion. Mild cord flattening and mild spinal stenosis  C4-5: Moderately large central and left-sided disc protrusion indenting the cord with mild to moderate spinal stenosis. Bilateral facet hypertrophy. Mild foraminal narrowing bilaterally  C5-6: Disc degeneration and spondylosis. Bilateral facet hypertrophy. Mild spinal stenosis and mild foraminal stenosis bilaterally.  C6-7: Moderate disc degeneration and spondylosis. Bilateral facet hypertrophy. Mild spinal stenosis and mild left foraminal stenosis.  C7-T1: Mild disc degeneration and spondylosis. Mild facet degeneration.  IMPRESSION: Mild chronic fracture of T2.  No acute cervical spine fracture.  Multilevel cervical degenerative change. Multiple areas of spinal stenosis, most prominent at C4-5 with a moderately large central disc protrusion causing cord flattening and mild to moderate spinal stenosis.   Electronically Signed   By: Franchot Gallo M.D.   On: 11/24/2014 18:57    Antibiotics:  Anti-infectives    Start     Dose/Rate Route Frequency Ordered Stop   12/14/14 2100  ceFAZolin (ANCEF) IVPB 1 g/50 mL premix     1 g 100 mL/hr over 30 Minutes Intravenous Every 8 hours 12/14/14 1542 12/15/14 0623   12/14/14 1300  ceFAZolin (ANCEF) IVPB 2 g/50 mL premix     2 g 100 mL/hr over 30 Minutes Intravenous To [facility] 12/13/14 0949 12/14/14 1300      Discharge Exam: Blood pressure 135/64, pulse 63, temperature  97.8 F (36.6 C), temperature source Oral, resp. rate 20, height 4\' 11"  (1.499 m), weight 168 lb 9.6 oz (76.476 kg), SpO2 95 %. Neurologic: Grossly normal Incision clean dry and intact   Discharge Medications:      Medication List    TAKE these medications        albuterol 108 (90 BASE) MCG/ACT inhaler  Commonly known as:  PROVENTIL HFA;VENTOLIN HFA  Inhale 1 puff into the lungs every 6 (six) hours as needed for shortness of breath.     allopurinol 100 MG tablet  Commonly known as:  ZYLOPRIM  Take 100 mg by mouth daily.     amLODipine 5 MG tablet  Commonly known as:  NORVASC  Take 5 mg by mouth daily.     atenolol 50 MG tablet  Commonly known as:  TENORMIN  Take 50 mg by mouth 2 (two) times daily.     CALCIUM + D PO  Take by mouth.     clonazePAM 1 MG tablet  Commonly known as:  KLONOPIN  Take 1 mg by mouth daily.     cyclobenzaprine 10 MG tablet  Commonly known as:  FLEXERIL  Take 10 mg by mouth 2 (two) times daily as needed.     diclofenac sodium 1 % Gel  Commonly known as:  VOLTAREN     fexofenadine 180 MG tablet  Commonly known as:  ALLEGRA  Take 180 mg by mouth daily.     HYDROcodone-acetaminophen 5-325 MG per tablet  Commonly known as:  NORCO  Take 1-2 tablets by mouth every 6 (six) hours as needed.     hydroxypropyl methylcellulose / hypromellose 2.5 % ophthalmic solution  Commonly known as:  ISOPTO TEARS / GONIOVISC  Place 1 drop into both eyes daily as needed for dry eyes.     multivitamin capsule  Take 1 capsule by mouth daily.     predniSONE 10 MG tablet  Commonly known as:  DELTASONE  Take 2 tablets (20 mg total) by mouth 2 (two) times daily.     valsartan 40 MG tablet  Commonly known as:  DIOVAN  Take 40 mg by mouth daily.        Disposition: Home, her family should stay with her for about 2 weeks for personal assistance  Final Dx: ACDF      Discharge Instructions    Call MD for:  difficulty breathing, headache or visual disturbances    Complete by:  As directed      Call MD for:  persistant nausea and vomiting    Complete by:  As directed      Call MD for:  redness, tenderness, or signs of infection (pain, swelling, redness, odor or  green/yellow discharge around incision site)    Complete by:  As directed      Call MD for:  severe uncontrolled pain    Complete by:  As directed      Call MD for:  temperature >100.4    Complete by:  As directed      Diet - low sodium heart healthy    Complete by:  As directed      Discharge instructions    Complete by:  As directed   No heavy lifting, may shower, no strenuous activity     Increase activity slowly    Complete by:  As directed               Signed: Usher Hedberg S 12/16/2014, 10:09 AM

## 2014-12-17 ENCOUNTER — Encounter (HOSPITAL_COMMUNITY): Payer: Self-pay | Admitting: Neurosurgery

## 2015-09-24 ENCOUNTER — Other Ambulatory Visit: Payer: Self-pay | Admitting: Internal Medicine

## 2015-10-14 ENCOUNTER — Inpatient Hospital Stay (HOSPITAL_BASED_OUTPATIENT_CLINIC_OR_DEPARTMENT_OTHER)
Admission: EM | Admit: 2015-10-14 | Discharge: 2015-10-17 | DRG: 193 | Disposition: A | Discharge status: Home / Self Care | Payer: Medicare Other | Attending: Internal Medicine | Admitting: Internal Medicine

## 2015-10-14 ENCOUNTER — Emergency Department (HOSPITAL_BASED_OUTPATIENT_CLINIC_OR_DEPARTMENT_OTHER): Payer: Medicare Other

## 2015-10-14 ENCOUNTER — Encounter (HOSPITAL_BASED_OUTPATIENT_CLINIC_OR_DEPARTMENT_OTHER): Payer: Self-pay

## 2015-10-14 DIAGNOSIS — M109 Gout, unspecified: Secondary | ICD-10-CM | POA: Diagnosis present

## 2015-10-14 DIAGNOSIS — F419 Anxiety disorder, unspecified: Secondary | ICD-10-CM | POA: Diagnosis present

## 2015-10-14 DIAGNOSIS — E8809 Other disorders of plasma-protein metabolism, not elsewhere classified: Secondary | ICD-10-CM | POA: Diagnosis present

## 2015-10-14 DIAGNOSIS — F329 Major depressive disorder, single episode, unspecified: Secondary | ICD-10-CM | POA: Diagnosis present

## 2015-10-14 DIAGNOSIS — E1165 Type 2 diabetes mellitus with hyperglycemia: Secondary | ICD-10-CM | POA: Diagnosis present

## 2015-10-14 DIAGNOSIS — E222 Syndrome of inappropriate secretion of antidiuretic hormone: Secondary | ICD-10-CM | POA: Diagnosis present

## 2015-10-14 DIAGNOSIS — Z981 Arthrodesis status: Secondary | ICD-10-CM

## 2015-10-14 DIAGNOSIS — J189 Pneumonia, unspecified organism: Secondary | ICD-10-CM | POA: Diagnosis present

## 2015-10-14 DIAGNOSIS — I10 Essential (primary) hypertension: Secondary | ICD-10-CM | POA: Diagnosis present

## 2015-10-14 DIAGNOSIS — M1990 Unspecified osteoarthritis, unspecified site: Secondary | ICD-10-CM | POA: Diagnosis present

## 2015-10-14 DIAGNOSIS — Z85828 Personal history of other malignant neoplasm of skin: Secondary | ICD-10-CM | POA: Diagnosis not present

## 2015-10-14 DIAGNOSIS — M549 Dorsalgia, unspecified: Secondary | ICD-10-CM | POA: Diagnosis present

## 2015-10-14 DIAGNOSIS — G8929 Other chronic pain: Secondary | ICD-10-CM | POA: Diagnosis present

## 2015-10-14 DIAGNOSIS — T380X5A Adverse effect of glucocorticoids and synthetic analogues, initial encounter: Secondary | ICD-10-CM | POA: Diagnosis present

## 2015-10-14 DIAGNOSIS — J9601 Acute respiratory failure with hypoxia: Secondary | ICD-10-CM | POA: Diagnosis present

## 2015-10-14 DIAGNOSIS — D508 Other iron deficiency anemias: Secondary | ICD-10-CM | POA: Diagnosis not present

## 2015-10-14 DIAGNOSIS — D638 Anemia in other chronic diseases classified elsewhere: Secondary | ICD-10-CM | POA: Diagnosis present

## 2015-10-14 DIAGNOSIS — J45901 Unspecified asthma with (acute) exacerbation: Secondary | ICD-10-CM | POA: Diagnosis present

## 2015-10-14 DIAGNOSIS — E119 Type 2 diabetes mellitus without complications: Secondary | ICD-10-CM

## 2015-10-14 DIAGNOSIS — E871 Hypo-osmolality and hyponatremia: Secondary | ICD-10-CM | POA: Diagnosis present

## 2015-10-14 DIAGNOSIS — Z79899 Other long term (current) drug therapy: Secondary | ICD-10-CM | POA: Diagnosis not present

## 2015-10-14 DIAGNOSIS — D649 Anemia, unspecified: Secondary | ICD-10-CM | POA: Diagnosis present

## 2015-10-14 DIAGNOSIS — Z9049 Acquired absence of other specified parts of digestive tract: Secondary | ICD-10-CM | POA: Diagnosis not present

## 2015-10-14 DIAGNOSIS — R05 Cough: Secondary | ICD-10-CM | POA: Diagnosis not present

## 2015-10-14 LAB — PROTIME-INR
INR: 1.01 (ref 0.00–1.49)
Prothrombin Time: 13.5 seconds (ref 11.6–15.2)

## 2015-10-14 LAB — COMPREHENSIVE METABOLIC PANEL
ALBUMIN: 2.4 g/dL — AB (ref 3.5–5.0)
ALT: 19 U/L (ref 14–54)
ANION GAP: 9 (ref 5–15)
AST: 36 U/L (ref 15–41)
Alkaline Phosphatase: 78 U/L (ref 38–126)
BILIRUBIN TOTAL: 0.4 mg/dL (ref 0.3–1.2)
BUN: 15 mg/dL (ref 6–20)
CO2: 30 mmol/L (ref 22–32)
Calcium: 8.6 mg/dL — ABNORMAL LOW (ref 8.9–10.3)
Chloride: 94 mmol/L — ABNORMAL LOW (ref 101–111)
Creatinine, Ser: 0.72 mg/dL (ref 0.44–1.00)
GFR calc Af Amer: >60 mL/min (ref >60)
GFR calc non Af Amer: >60 mL/min (ref >60)
GLUCOSE: 173 mg/dL — AB (ref 65–99)
Potassium: 4.2 mmol/L (ref 3.5–5.1)
Sodium: 133 mmol/L — ABNORMAL LOW (ref 135–145)
TOTAL PROTEIN: 6.5 g/dL (ref 6.5–8.1)

## 2015-10-14 LAB — I-STAT ARTERIAL BLOOD GAS, ED
ACID-BASE EXCESS: 4 mmol/L — AB (ref 0.0–2.0)
BICARBONATE: 28.7 meq/L — AB (ref 20.0–24.0)
O2 SAT: 82 %
TCO2: 30 mmol/L (ref 0–100)
pCO2 arterial: 41.5 mmHg (ref 35.0–45.0)
pH, Arterial: 7.45 (ref 7.350–7.450)
pO2, Arterial: 46 mmHg — ABNORMAL LOW (ref 80.0–100.0)

## 2015-10-14 LAB — TROPONIN I: Troponin I: <0.03 ng/mL (ref <0.031)

## 2015-10-14 LAB — CBC
HEMATOCRIT: 33.3 % — AB (ref 36.0–46.0)
HEMOGLOBIN: 10.9 g/dL — AB (ref 12.0–15.0)
MCH: 32.1 pg (ref 26.0–34.0)
MCHC: 32.7 g/dL (ref 30.0–36.0)
MCV: 97.9 fL (ref 78.0–100.0)
Platelets: 355 10*3/uL (ref 150–400)
RBC: 3.4 MIL/uL — ABNORMAL LOW (ref 3.87–5.11)
RDW: 14.3 % (ref 11.5–15.5)
WBC: 11.1 10*3/uL — ABNORMAL HIGH (ref 4.0–10.5)

## 2015-10-14 LAB — GLUCOSE, CAPILLARY: GLUCOSE-CAPILLARY: 248 mg/dL — AB (ref 65–99)

## 2015-10-14 LAB — I-STAT CG4 LACTIC ACID, ED: Lactic Acid, Venous: 1.76 mmol/L (ref 0.5–2.0)

## 2015-10-14 LAB — BRAIN NATRIURETIC PEPTIDE: B Natriuretic Peptide: 55.5 pg/mL (ref 0.0–100.0)

## 2015-10-14 MED ORDER — IPRATROPIUM BROMIDE 0.02 % IN SOLN
0.5000 mg | Freq: Four times a day (QID) | RESPIRATORY_TRACT | Status: DC
  Administered 2015-10-14 – 2015-10-15 (×5): 0.5 mg via RESPIRATORY_TRACT
  Filled 2015-10-14 (×5): qty 2.5

## 2015-10-14 MED ORDER — IRBESARTAN 75 MG PO TABS
37.5000 mg | ORAL_TABLET | Freq: Every day | ORAL | Status: DC
  Administered 2015-10-15 – 2015-10-17 (×3): 37.5 mg via ORAL
  Filled 2015-10-14 (×3): qty 0.5

## 2015-10-14 MED ORDER — ENOXAPARIN SODIUM 40 MG/0.4ML ~~LOC~~ SOLN
40.0000 mg | SUBCUTANEOUS | Status: DC
  Administered 2015-10-14 – 2015-10-16 (×3): 40 mg via SUBCUTANEOUS
  Filled 2015-10-14 (×3): qty 0.4

## 2015-10-14 MED ORDER — ALLOPURINOL 100 MG PO TABS
100.0000 mg | ORAL_TABLET | Freq: Every day | ORAL | Status: DC
  Administered 2015-10-14 – 2015-10-17 (×4): 100 mg via ORAL
  Filled 2015-10-14 (×4): qty 1

## 2015-10-14 MED ORDER — INSULIN ASPART 100 UNIT/ML ~~LOC~~ SOLN
0.0000 [IU] | Freq: Three times a day (TID) | SUBCUTANEOUS | Status: DC
  Administered 2015-10-15: 5 [IU] via SUBCUTANEOUS
  Administered 2015-10-15: 2 [IU] via SUBCUTANEOUS
  Administered 2015-10-15: 7 [IU] via SUBCUTANEOUS
  Administered 2015-10-16: 9 [IU] via SUBCUTANEOUS
  Administered 2015-10-16: 7 [IU] via SUBCUTANEOUS
  Administered 2015-10-16 – 2015-10-17 (×2): 2 [IU] via SUBCUTANEOUS
  Administered 2015-10-17: 7 [IU] via SUBCUTANEOUS

## 2015-10-14 MED ORDER — IPRATROPIUM BROMIDE 0.02 % IN SOLN
0.5000 mg | Freq: Once | RESPIRATORY_TRACT | Status: AC
  Administered 2015-10-14: 0.5 mg via RESPIRATORY_TRACT
  Filled 2015-10-14: qty 2.5

## 2015-10-14 MED ORDER — METHYLPREDNISOLONE SODIUM SUCC 40 MG IJ SOLR
40.0000 mg | Freq: Four times a day (QID) | INTRAMUSCULAR | Status: DC
  Administered 2015-10-14 – 2015-10-15 (×3): 40 mg via INTRAVENOUS
  Filled 2015-10-14 (×3): qty 1

## 2015-10-14 MED ORDER — DEXTROSE 5 % IV SOLN
1.0000 g | INTRAVENOUS | Status: DC
  Administered 2015-10-15 – 2015-10-16 (×2): 1 g via INTRAVENOUS
  Filled 2015-10-14 (×3): qty 10

## 2015-10-14 MED ORDER — ALBUTEROL SULFATE (2.5 MG/3ML) 0.083% IN NEBU
2.5000 mg | INHALATION_SOLUTION | Freq: Four times a day (QID) | RESPIRATORY_TRACT | Status: AC
  Administered 2015-10-14 – 2015-10-15 (×2): 2.5 mg via RESPIRATORY_TRACT
  Filled 2015-10-14 (×2): qty 3

## 2015-10-14 MED ORDER — SODIUM CHLORIDE 0.9% FLUSH
3.0000 mL | Freq: Two times a day (BID) | INTRAVENOUS | Status: DC
  Administered 2015-10-14 – 2015-10-17 (×6): 3 mL via INTRAVENOUS

## 2015-10-14 MED ORDER — AMLODIPINE BESYLATE 5 MG PO TABS
5.0000 mg | ORAL_TABLET | Freq: Every day | ORAL | Status: DC
  Administered 2015-10-14 – 2015-10-17 (×4): 5 mg via ORAL
  Filled 2015-10-14 (×4): qty 1

## 2015-10-14 MED ORDER — ATENOLOL 50 MG PO TABS
50.0000 mg | ORAL_TABLET | Freq: Two times a day (BID) | ORAL | Status: DC
  Administered 2015-10-14 – 2015-10-17 (×6): 50 mg via ORAL
  Filled 2015-10-14 (×6): qty 1

## 2015-10-14 MED ORDER — DEXTROSE 5 % IV SOLN
500.0000 mg | INTRAVENOUS | Status: DC
  Administered 2015-10-15 – 2015-10-16 (×2): 500 mg via INTRAVENOUS
  Filled 2015-10-14 (×3): qty 500

## 2015-10-14 MED ORDER — DEXTROSE 5 % IV SOLN
1.0000 g | Freq: Once | INTRAVENOUS | Status: AC
  Administered 2015-10-14: 1 g via INTRAVENOUS
  Filled 2015-10-14: qty 10

## 2015-10-14 MED ORDER — ALBUTEROL SULFATE (2.5 MG/3ML) 0.083% IN NEBU
5.0000 mg | INHALATION_SOLUTION | RESPIRATORY_TRACT | Status: DC | PRN
Start: 2015-10-14 — End: 2015-10-14
  Administered 2015-10-14: 5 mg via RESPIRATORY_TRACT
  Filled 2015-10-14: qty 6

## 2015-10-14 MED ORDER — AZITHROMYCIN 250 MG PO TABS
500.0000 mg | ORAL_TABLET | Freq: Once | ORAL | Status: AC
  Administered 2015-10-14: 500 mg via ORAL
  Filled 2015-10-14: qty 2

## 2015-10-14 MED ORDER — ALBUTEROL SULFATE (2.5 MG/3ML) 0.083% IN NEBU: 2.5000 mg | INHALATION_SOLUTION | RESPIRATORY_TRACT | Status: DC | PRN

## 2015-10-14 MED ORDER — HYDROCODONE-ACETAMINOPHEN 5-325 MG PO TABS: 1.0000 | ORAL_TABLET | ORAL | Status: DC | PRN

## 2015-10-14 MED ORDER — ONDANSETRON HCL 4 MG/2ML IJ SOLN: 4.0000 mg | Freq: Four times a day (QID) | INTRAMUSCULAR | Status: DC | PRN

## 2015-10-14 MED ORDER — HYPROMELLOSE (GONIOSCOPIC) 2.5 % OP SOLN: 1.0000 [drp] | Freq: Every day | OPHTHALMIC | Status: DC | PRN

## 2015-10-14 MED ORDER — METHYLPREDNISOLONE SODIUM SUCC 125 MG IJ SOLR
125.0000 mg | Freq: Once | INTRAMUSCULAR | Status: AC
  Administered 2015-10-14: 125 mg via INTRAVENOUS
  Filled 2015-10-14: qty 2

## 2015-10-14 MED ORDER — CALCIUM CARBONATE-VITAMIN D 500-200 MG-UNIT PO TABS
1.0000 | ORAL_TABLET | Freq: Three times a day (TID) | ORAL | Status: DC
  Administered 2015-10-15 – 2015-10-17 (×8): 1 via ORAL
  Filled 2015-10-14 (×8): qty 1

## 2015-10-14 MED ORDER — ONDANSETRON HCL 4 MG PO TABS: 4.0000 mg | ORAL_TABLET | Freq: Four times a day (QID) | ORAL | Status: DC | PRN

## 2015-10-14 MED ORDER — LORATADINE 10 MG PO TABS
10.0000 mg | ORAL_TABLET | Freq: Every day | ORAL | Status: DC
  Administered 2015-10-14 – 2015-10-17 (×4): 10 mg via ORAL
  Filled 2015-10-14 (×4): qty 1

## 2015-10-14 MED ORDER — CLONAZEPAM 0.5 MG PO TABS: 1.0000 mg | ORAL_TABLET | Freq: Every day | ORAL | Status: DC | PRN

## 2015-10-14 MED ORDER — CYCLOBENZAPRINE HCL 10 MG PO TABS: 10.0000 mg | ORAL_TABLET | Freq: Two times a day (BID) | ORAL | Status: DC | PRN

## 2015-10-14 NOTE — ED Notes (Signed)
MD at bedside. 

## 2015-10-14 NOTE — ED Notes (Signed)
Patient transported to X-ray 

## 2015-10-14 NOTE — ED Provider Notes (Signed)
CSN: [id number]     Arrival date & time 10/14/15  1442 History  By signing my name below, I, Hansel Feinstein, attest that this documentation has been prepared under the direction and in the presence of Dorie Rank, MD. Electronically Signed: Hansel Feinstein, ED Scribe. 10/14/2015. 3:10 PM.     Chief Complaint  Patient presents with  . Cough   The history is provided by the patient. No language interpreter was used.   HPI Comments: Jo Hayes is a 82 y.o. female with h/o asthma who presents to the Emergency Department complaining of intermittent cough for 2 months and worsened 2 days ago with associated SOB, subjective fever, orthostatic dizziness. Per pt, her symptoms were initially mild and consistent with seasonal allergies before worsening. She reports she has been evaluated by her PCP twice for this complaint with no significant relief of symptoms. Pt was prescribed Prednisone last month with temporary relief of her symptoms until her cough worsened again this week. Per daughter, the pt has had decreased appetite and thirst with her current symptoms, which can also be attributed to the recent death of her son d/t PNA. No h/o CHF, PE/DVT, CAD. Pt is a non-smoker and has never smoked.    Past Medical History  Diagnosis Date  . Arthritis   . Blood transfusion without reported diagnosis   . Asthma   . Skin cancer of face   . Hypertension   . Diabetes mellitus without complication ([place])   . Gout   . Anxiety   . Fall at home     hx of falls   Past Surgical History  Procedure Laterality Date  . Abdominal hysterectomy    . Cholecystectomy    . Ankle surgery    . Tonsillectomy    . Skin biopsy    . Back surgery    . Anterior cervical decomp/discectomy fusion N/A 12/14/2014    Procedure: ANTERIOR CERVICAL DECOMPRESSION/DISCECTOMY FUSION  CERVICALFOUR-FIVE WITH PLATING BONEGRAFT;  Surgeon: Ashok Pall, MD;  Location: [facility];  Service: Neurosurgery;  Laterality: N/A;   No family history on  file. Social History  Substance Use Topics  . Smoking status: Never Smoker   . Smokeless tobacco: Never Used  . Alcohol Use: No   OB History    No data available     Review of Systems  Constitutional: Positive for fever.  Respiratory: Positive for cough and shortness of breath.   Neurological: Positive for dizziness.  All other systems reviewed and are negative.  Allergies  Avelox; Demerol; Hydrochlorothiazide; and Sulfa antibiotics  Home Medications   Prior to Admission medications   Medication Sig Start Date End Date Taking? Authorizing Provider  albuterol (PROVENTIL HFA;VENTOLIN HFA) 108 (90 BASE) MCG/ACT inhaler Inhale 1 puff into the lungs every 6 (six) hours as needed for shortness of breath.    Historical Provider, MD  allopurinol (ZYLOPRIM) 100 MG tablet Take 100 mg by mouth daily.    Historical Provider, MD  amLODipine (NORVASC) 5 MG tablet Take 5 mg by mouth daily.    Historical Provider, MD  atenolol (TENORMIN) 50 MG tablet Take 50 mg by mouth 2 (two) times daily.    Historical Provider, MD  Calcium Carbonate-Vitamin D (CALCIUM + D PO) Take by mouth.    Historical Provider, MD  clonazePAM (KLONOPIN) 1 MG tablet Take 1 mg by mouth daily.     Historical Provider, MD  cyclobenzaprine (FLEXERIL) 10 MG tablet Take 10 mg by mouth 2 (two) times daily as  needed. 07/09/14   Historical Provider, MD  diclofenac sodium (VOLTAREN) 1 % GEL  10/22/14   Historical Provider, MD  fexofenadine (ALLEGRA) 180 MG tablet Take 180 mg by mouth daily.    Historical Provider, MD  HYDROcodone-acetaminophen (NORCO) 5-325 MG per tablet Take 1-2 tablets by mouth every 6 (six) hours as needed. 12/16/14   Eustace Moore, MD  hydroxypropyl methylcellulose / hypromellose (ISOPTO TEARS / GONIOVISC) 2.5 % ophthalmic solution Place 1 drop into both eyes daily as needed for dry eyes.    Historical Provider, MD  Multiple Vitamin (MULTIVITAMIN) capsule Take 1 capsule by mouth daily.    Historical Provider, MD   predniSONE (DELTASONE) 10 MG tablet Take 2 tablets (20 mg total) by mouth 2 (two) times daily. Patient not taking: Reported on 12/05/2014 11/24/14   Veryl Speak, MD  valsartan (DIOVAN) 40 MG tablet Take 40 mg by mouth daily.    Historical Provider, MD   BP 117/44 mmHg  Pulse 97  Temp(Src) 100.5 F (38.1 C) (Rectal)  Resp 20  SpO2 98% Physical Exam  Constitutional: No distress.  Elderly    HENT:  Head: Normocephalic and atraumatic.  Right Ear: External ear normal.  Left Ear: External ear normal.  Eyes: Conjunctivae are normal. Right eye exhibits no discharge. Left eye exhibits no discharge. No scleral icterus.  Neck: Neck supple. No tracheal deviation present.  Cardiovascular: Normal rate, regular rhythm and intact distal pulses.   Pulmonary/Chest: Effort normal. No accessory muscle usage or stridor. No respiratory distress. She has wheezes. She has rales.  Abdominal: Soft. Bowel sounds are normal. She exhibits no distension. There is no tenderness. There is no rebound and no guarding.  Musculoskeletal: Normal range of motion. She exhibits edema. She exhibits no tenderness.  Mild pitting edema to bilateral ankles and feet.   Neurological: She is alert. She has normal strength. No cranial nerve deficit (no facial droop, extraocular movements intact, no slurred speech) or sensory deficit. She exhibits normal muscle tone. She displays no seizure activity. Coordination normal.  Skin: Skin is warm and dry. No rash noted.  Psychiatric: She has a normal mood and affect. Her behavior is normal.  Nursing note and vitals reviewed.   ED Course  Procedures (including critical care time) DIAGNOSTIC STUDIES: Oxygen Saturation is 83% on [place] 4L, low by my interpretation.    COORDINATION OF CARE: 3:09 PM Discussed treatment plan with pt at bedside which includes CXR, lab work and pt agreed to plan.   Medications  albuterol (PROVENTIL) (2.5 MG/3ML) 0.083% nebulizer solution 5 mg (5 mg  Nebulization Given 10/14/15 1528)  ipratropium (ATROVENT) nebulizer solution 0.5 mg (0.5 mg Nebulization Given 10/14/15 1528)  methylPREDNISolone sodium succinate (SOLU-MEDROL) 125 mg/2 mL injection 125 mg (125 mg Intravenous Given 10/14/15 1526)  cefTRIAXone (ROCEPHIN) 1 g in dextrose 5 % 50 mL IVPB (1 g Intravenous New Bag/Given 10/14/15 1542)  azithromycin (ZITHROMAX) tablet 500 mg (500 mg Oral Given 10/14/15 1542)    Labs Review Labs Reviewed  CBC - Abnormal; Notable for the following:    WBC 11.1 (*)    RBC 3.40 (*)    Hemoglobin 10.9 (*)    HCT 33.3 (*)    All other components within normal limits  COMPREHENSIVE METABOLIC PANEL - Abnormal; Notable for the following:    Sodium 133 (*)    Chloride 94 (*)    Glucose, Bld 173 (*)    Calcium 8.6 (*)    Albumin 2.4 (*)    All  other components within normal limits  I-STAT ARTERIAL BLOOD GAS, ED - Abnormal; Notable for the following:    pO2, Arterial 46.0 (*)    Bicarbonate 28.7 (*)    Acid-Base Excess 4.0 (*)    All other components within normal limits  PROTIME-INR  BRAIN NATRIURETIC PEPTIDE  TROPONIN I  I-STAT CG4 LACTIC ACID, ED    Imaging Review Dg Chest 2 View  10/14/2015  CLINICAL DATA:  Difficulty breathing for 4 days EXAM: CHEST  2 VIEW COMPARISON:  02/15/2009 FINDINGS: Cardiac shadow is within normal limits. Patchy perihilar infiltrates are noted bilaterally with some more marked right upper lobe infiltrate. No sizable effusion is seen. No bony abnormality is noted. IMPRESSION: Bilateral perihilar and right upper lobe infiltrates. Electronically Signed   By: Inez Catalina M.D.   On: 10/14/2015 15:31   I have personally reviewed and evaluated these images and lab results as part of my medical decision-making.   EKG Interpretation   Date/Time:  Monday Oct 14 2015 14:56:56 EDT Ventricular Rate:  93 PR Interval:  151 QRS Duration: 89 QT Interval:  345 QTC Calculation: 429 R Axis:   178 Text Interpretation:  Sinus or  ectopic atrial rhythm Probable lateral  infarct, age indeterminate Baseline wander in lead(s) II III aVR aVF V3 V4  V6 Confirmed by HAVILAND MD, JULIE ([id number]) on 10/14/2015 3:04:15 PM      MDM   Final diagnoses:  CAP (community acquired pneumonia)   CXR suggests pna.  Exam is consistent with a component of bronchospasm and hypoxia that is responding to oxygen and nebs.  Doubt sepsis, chf based on exam and labs.  Will start on abx for CAP.  Continue breathing treatments and steroids.  Will consult with medical service for admission and transfer  I personally performed the services described in this documentation, which was scribed in my presence.  The recorded information has been reviewed and is accurate.   Dorie Rank, MD 10/14/15 1620

## 2015-10-14 NOTE — H&P (Signed)
History and Physical    Jo Hayes [id number] DOB: Nov 11, 1927 DOA: 10/14/2015  PCP: Nicola Girt, DO   Patient coming from: Home.  Chief Complaint: Cough.  HPI: Jo Hayes is a 82 y.o. female with medical history significant of osteoarthritis, gout, chronic back pain, hypertension, type 2 diabetes, anxiety, skin cancer of the face, who was transferred from [facility] to this facility after presenting there with progressively worse cough and shortness of breath for 2 months.  Per patient, on March 18th, 2017, she started noticing that she had allergy-like symptoms. She states that this progressed to cough and wheezing. As symptoms persisted, she subsequently saw a physician who prescribed her prednisone for a few the with temporary relief. She then, last month, went to a different provider who prescribed her a different inhaler, which apparently was not covered under her [employer] prescription formulary and she was unable to get a similar medication. Since then, for several weeks, her cough has become productive, she has had increased wheezing and has been more tired. She also reports decreased oral intake, recent lower extremites edema, subjective fever, chills and worsening fatigue.  The patient is also grieving the recent death of her son from pneumonia and this is contributing to her lack of appetite, insomnia and depressed mood.  ED Course: Workup in the ER shows mild leukocytosis, mild anemia, mild hyponatremia, hyperglycemia and a chest radiograph with bilateral perihilar infiltrates. She received supplemental oxygen, bronchodilators, IV glucocorticoids and IV antibiotics and reports relief.  Review of Systems: As per HPI otherwise 10 point review of systems negative.  Past Medical History  Diagnosis Date  . Arthritis   . Blood transfusion without reported diagnosis   . Asthma   . Skin cancer of face   . Hypertension   . Diabetes mellitus without complication  ([place])   . Gout   . Anxiety   . Fall at home     hx of falls    Past Surgical History  Procedure Laterality Date  . Abdominal hysterectomy    . Cholecystectomy    . Ankle surgery    . Tonsillectomy    . Skin biopsy    . Back surgery    . Anterior cervical decomp/discectomy fusion N/A 12/14/2014    Procedure: ANTERIOR CERVICAL DECOMPRESSION/DISCECTOMY FUSION  CERVICALFOUR-FIVE WITH PLATING BONEGRAFT;  Surgeon: Ashok Pall, MD;  Location: [place] [facility];  Service: Neurosurgery;  Laterality: N/A;     reports that she has never smoked. She has never used smokeless tobacco. She reports that she does not drink alcohol or use illicit drugs.  Allergies  Allergen Reactions  . Avelox [Moxifloxacin Hcl In Nacl] Other (See Comments)    Jittery "nervous wreck"  . Demerol [Meperidine] Other (See Comments)    hallucination  . Hydrochlorothiazide Other (See Comments)    "pee too much"  . Sulfa Antibiotics Other (See Comments)    Face flushed red    History reviewed. No pertinent family history.   Prior to Admission medications   Medication Sig Start Date End Date Taking? Authorizing Provider  albuterol (PROVENTIL HFA;VENTOLIN HFA) 108 (90 BASE) MCG/ACT inhaler Inhale 1 puff into the lungs every 6 (six) hours as needed for shortness of breath.    Historical Provider, MD  allopurinol (ZYLOPRIM) 100 MG tablet Take 100 mg by mouth daily.    Historical Provider, MD  amLODipine (NORVASC) 5 MG tablet Take 5 mg by mouth daily.    Historical Provider, MD  atenolol (TENORMIN) 50  MG tablet Take 50 mg by mouth 2 (two) times daily.    Historical Provider, MD  Calcium Carbonate-Vitamin D (CALCIUM + D PO) Take by mouth.    Historical Provider, MD  clonazePAM (KLONOPIN) 1 MG tablet Take 1 mg by mouth daily.     Historical Provider, MD  cyclobenzaprine (FLEXERIL) 10 MG tablet Take 10 mg by mouth 2 (two) times daily as needed. 07/09/14   Historical Provider, MD  diclofenac sodium (VOLTAREN) 1 % GEL  10/22/14    Historical Provider, MD  fexofenadine (ALLEGRA) 180 MG tablet Take 180 mg by mouth daily.    Historical Provider, MD  HYDROcodone-acetaminophen (NORCO) 5-325 MG per tablet Take 1-2 tablets by mouth every 6 (six) hours as needed. 12/16/14   Eustace Moore, MD  hydroxypropyl methylcellulose / hypromellose (ISOPTO TEARS / GONIOVISC) 2.5 % ophthalmic solution Place 1 drop into both eyes daily as needed for dry eyes.    Historical Provider, MD  Multiple Vitamin (MULTIVITAMIN) capsule Take 1 capsule by mouth daily.    Historical Provider, MD  predniSONE (DELTASONE) 10 MG tablet Take 2 tablets (20 mg total) by mouth 2 (two) times daily. Patient not taking: Reported on 12/05/2014 11/24/14   Veryl Speak, MD  valsartan (DIOVAN) 40 MG tablet Take 40 mg by mouth daily.    Historical Provider, MD    Physical Exam: Filed Vitals:   10/14/15 1600 10/14/15 1630 10/14/15 1700 10/14/15 1841  BP: 117/44 109/45 113/49 129/67  Pulse: 97 95 95 96  Temp:    97.4 F (36.3 C)  TempSrc:    Oral  Resp: 20 23 19 18   Height:    5' (1.524 m)  Weight:    68.992 kg (152 lb 1.6 oz)  SpO2: 98% 98% 97% 98%      Constitutional: NAD, calm, comfortable Filed Vitals:   10/14/15 1600 10/14/15 1630 10/14/15 1700 10/14/15 1841  BP: 117/44 109/45 113/49 129/67  Pulse: 97 95 95 96  Temp:    97.4 F (36.3 C)  TempSrc:    Oral  Resp: 20 23 19 18   Height:    5' (1.524 m)  Weight:    68.992 kg (152 lb 1.6 oz)  SpO2: 98% 98% 97% 98%   Eyes: PERRL, lids and conjunctivae normal ENMT: Mucous membranes are moist. Posterior pharynx clear of any exudate or lesions.  Neck: normal, supple, no masses, no thyromegaly Respiratory: Bilateral rales. No wheezing at this time. No accessory muscle use.  Cardiovascular: Regular rate and rhythm, no murmurs / rubs / gallops. Trace lower extremity pitting edema.                             2+ pedal pulses. No carotid bruits.  Abdomen: Soft, no tenderness, no masses palpated. No  hepatosplenomegaly. Bowel sounds positive.  Musculoskeletal: No clubbing / cyanosis. No joint deformity upper and lower extremities.                              Good ROM, no contractures. Normal muscle tone.  Skin: Multiple hyperpigmented lesions on her trunk, particularly on her back.  Neurologic: CN 2-12 grossly intact. Sensation intact, DTR normal. Strength 5/5 in all 4.  Psychiatric: Normal judgment and insight. Alert and oriented x 4. Depressed mood due to grieving recent son's death.     Labs on Admission: I have personally reviewed following labs and imaging  studies  CBC:  Recent Labs Lab 10/14/15 1450  WBC 11.1*  HGB 10.9*  HCT 33.3*  MCV 97.9  PLT [id number]   Basic Metabolic Panel:  Recent Labs Lab 10/14/15 1450  NA 133*  K 4.2  CL 94*  CO2 30  GLUCOSE 173*  BUN 15  CREATININE 0.72  CALCIUM 8.6*   GFR: Estimated Creatinine Clearance: 43.7 mL/min (by C-G formula based on Cr of 0.72). Liver Function Tests:  Recent Labs Lab 10/14/15 1450  AST 36  ALT 19  ALKPHOS 78  BILITOT 0.4  PROT 6.5  ALBUMIN 2.4*   Coagulation Profile:  Recent Labs Lab 10/14/15 1450  INR 1.01   Cardiac Enzymes:  Recent Labs Lab 10/14/15 1450  TROPONINI <0.03    Radiological Exams on Admission: Dg Chest 2 View  10/14/2015  CLINICAL DATA:  Difficulty breathing for 4 days EXAM: CHEST  2 VIEW COMPARISON:  02/15/2009 FINDINGS: Cardiac shadow is within normal limits. Patchy perihilar infiltrates are noted bilaterally with some more marked right upper lobe infiltrate. No sizable effusion is seen. No bony abnormality is noted. IMPRESSION: Bilateral perihilar and right upper lobe infiltrates. Electronically Signed   By: Inez Catalina M.D.   On: 10/14/2015 15:31    EKG: Independently reviewed. Vent. rate 93 BPM PR interval 151 ms QRS duration 89 ms QT/QTc 345/429 ms P-R-T axes 137 178 230 Sinus or ectopic atrial rhythm Probable lateral infarct, age indeterminate Baseline wander  in lead(s) II III aVR aVF V3 V4 V6  Assessment/Plan Principal Problem:   CAP (community acquired pneumonia) Admit to telemetry a/inpatient. Continue supplemental oxygen. Continue bronchodilators. Continue Zithromax and Rocephin IVPB per pharmacy. Check a sputum Gram stain, culture and sensitivity. Check Legionella and strep pneumoniae urine antigens.  Active Problems:   Asthma exacerbation Continue supplemental oxygen. Continue bronchodilators as needed. Continue Allegra 180 mg by mouth daily or equivalent formulary Continue low-dose methylprednisolone.     Essential hypertension Continue amlodipine 5 mg by mouth daily. Continue atenolol 50 mg by mouth twice a day. Continue Diovan 40 mg by mouth daily or equivalent formulary. Blood pressure monitoring.      Type 2 diabetes mellitus (HCC) Carbohydrate modified diet. CBG monitoring with regular insulin sliding scale while the patient is on glucocorticoids.    Chronic back pain Continue cyclobenzaprine 10 mg by mouth twice a day as needed for muscle spasms. Continue Norco 5/325 mg tablets as needed for pain.    Gout Continue allopurinol 100 mg by mouth daily.    Hypoalbuminemia The patient has not been eating properly over the last 2 months due to inability to cook secondary to asthma symptoms. She is also grieving the death of her son, who died recently at home. She was the one finding him unresponsive and witnessed the code being run by 911 responders at home.     Hyponatremia Level is minimally low and consistent with previous level from July last year. Follow-up sodium level in a.m. Expand workup if it worsens.    Anemia Check anemia panel in a.m. Monitor H&H.    DVT prophylaxis: Lovenox. Code Status: Full code. Family Communication:  Disposition Plan: Admit for asthma exacerbation treatment and IV antibiotics for 3-4 days. Consults called:  Admission status: Inpatient/telemetry    Reubin Milan  MD [facility] Pager [phone].  If 7PM-7AM, please contact night-coverage [email or web address] Password [facility]  10/14/2015, 7:31 PM

## 2015-10-14 NOTE — ED Notes (Signed)
Pt reports about 1.5 wk with cough and weakness.  Decreased po intake.

## 2015-10-14 NOTE — Progress Notes (Signed)
Pt w/ acute respiratory failure w/ hypoxia from CAP.  Coming from [facility]

## 2015-10-15 DIAGNOSIS — D508 Other iron deficiency anemias: Secondary | ICD-10-CM

## 2015-10-15 DIAGNOSIS — J189 Pneumonia, unspecified organism: Principal | ICD-10-CM

## 2015-10-15 DIAGNOSIS — J45901 Unspecified asthma with (acute) exacerbation: Secondary | ICD-10-CM

## 2015-10-15 LAB — FERRITIN: Ferritin: 553 ng/mL — ABNORMAL HIGH (ref 11–307)

## 2015-10-15 LAB — CBC WITH DIFFERENTIAL/PLATELET
Basophils Absolute: 0 10*3/uL (ref 0.0–0.1)
Basophils Relative: 0 %
EOS ABS: 0 10*3/uL (ref 0.0–0.7)
EOS PCT: 0 %
HCT: 29.5 % — ABNORMAL LOW (ref 36.0–46.0)
Hemoglobin: 9.3 g/dL — ABNORMAL LOW (ref 12.0–15.0)
LYMPHS ABS: 0.8 10*3/uL (ref 0.7–4.0)
Lymphocytes Relative: 12 %
MCH: 30.6 pg (ref 26.0–34.0)
MCHC: 31.5 g/dL (ref 30.0–36.0)
MCV: 97 fL (ref 78.0–100.0)
MONOS PCT: 1 %
Monocytes Absolute: 0 10*3/uL — ABNORMAL LOW (ref 0.1–1.0)
Neutro Abs: 5.6 10*3/uL (ref 1.7–7.7)
Neutrophils Relative %: 87 %
PLATELETS: 307 10*3/uL (ref 150–400)
RBC: 3.04 MIL/uL — ABNORMAL LOW (ref 3.87–5.11)
RDW: 14.5 % (ref 11.5–15.5)
WBC: 6.4 10*3/uL (ref 4.0–10.5)

## 2015-10-15 LAB — MAGNESIUM: Magnesium: 1.9 mg/dL (ref 1.7–2.4)

## 2015-10-15 LAB — COMPREHENSIVE METABOLIC PANEL
ALT: 18 U/L (ref 14–54)
AST: 27 U/L (ref 15–41)
Albumin: 1.9 g/dL — ABNORMAL LOW (ref 3.5–5.0)
Alkaline Phosphatase: 63 U/L (ref 38–126)
Anion gap: 8 (ref 5–15)
BUN: 14 mg/dL (ref 6–20)
CHLORIDE: 94 mmol/L — AB (ref 101–111)
CO2: 30 mmol/L (ref 22–32)
CREATININE: 0.89 mg/dL (ref 0.44–1.00)
Calcium: 8.2 mg/dL — ABNORMAL LOW (ref 8.9–10.3)
GFR calc Af Amer: >60 mL/min (ref >60)
GFR, EST NON AFRICAN AMERICAN: 57 mL/min — AB (ref >60)
GLUCOSE: 363 mg/dL — AB (ref 65–99)
Potassium: 4.3 mmol/L (ref 3.5–5.1)
SODIUM: 132 mmol/L — AB (ref 135–145)
Total Bilirubin: 0.2 mg/dL — ABNORMAL LOW (ref 0.3–1.2)
Total Protein: 5.8 g/dL — ABNORMAL LOW (ref 6.5–8.1)

## 2015-10-15 LAB — RETICULOCYTES
RBC.: 3.04 MIL/uL — AB (ref 3.87–5.11)
RETIC COUNT ABSOLUTE: 149 10*3/uL (ref 19.0–186.0)
Retic Ct Pct: 4.9 % — ABNORMAL HIGH (ref 0.4–3.1)

## 2015-10-15 LAB — VITAMIN B12: VITAMIN B 12: 2012 pg/mL — AB (ref 180–914)

## 2015-10-15 LAB — GLUCOSE, CAPILLARY
GLUCOSE-CAPILLARY: 262 mg/dL — AB (ref 65–99)
GLUCOSE-CAPILLARY: 266 mg/dL — AB (ref 65–99)
Glucose-Capillary: 310 mg/dL — ABNORMAL HIGH (ref 65–99)
Glucose-Capillary: 224 mg/dL — ABNORMAL HIGH (ref 65–99)

## 2015-10-15 LAB — PHOSPHORUS: PHOSPHORUS: 5.1 mg/dL — AB (ref 2.5–4.6)

## 2015-10-15 LAB — FOLATE: FOLATE: 21.8 ng/mL (ref >5.9)

## 2015-10-15 LAB — IRON AND TIBC
Iron: 32 ug/dL (ref 28–170)
Saturation Ratios: 21 % (ref 10.4–31.8)
TIBC: 155 ug/dL — ABNORMAL LOW (ref 250–450)
UIBC: 123 ug/dL

## 2015-10-15 LAB — STREP PNEUMONIAE URINARY ANTIGEN: STREP PNEUMO URINARY ANTIGEN: NEGATIVE

## 2015-10-15 MED ORDER — PREDNISONE 20 MG PO TABS
40.0000 mg | ORAL_TABLET | Freq: Two times a day (BID) | ORAL | Status: DC
  Administered 2015-10-15 – 2015-10-16 (×2): 40 mg via ORAL
  Filled 2015-10-15 (×2): qty 2

## 2015-10-15 MED ORDER — IPRATROPIUM-ALBUTEROL 0.5-2.5 (3) MG/3ML IN SOLN
3.0000 mL | Freq: Three times a day (TID) | RESPIRATORY_TRACT | Status: DC
  Administered 2015-10-16 – 2015-10-17 (×4): 3 mL via RESPIRATORY_TRACT
  Filled 2015-10-15 (×4): qty 3

## 2015-10-15 MED ORDER — INSULIN GLARGINE 100 UNIT/ML ~~LOC~~ SOLN
7.0000 [IU] | Freq: Every day | SUBCUTANEOUS | Status: AC
  Administered 2015-10-15 – 2015-10-16 (×2): 7 [IU] via SUBCUTANEOUS
  Filled 2015-10-15 (×2): qty 0.07

## 2015-10-15 NOTE — Progress Notes (Addendum)
Inpatient Diabetes Program Recommendations  AACE/ADA: New Consensus Statement on Inpatient Glycemic Control (2015)  Target Ranges:  Prepandial:   less than 140 mg/dL      Peak postprandial:   less than 180 mg/dL (1-2 hours)      Critically ill patients:  140 - 180 mg/dL  Results for KWAN, BERLAND (MRN [id number]) as of 10/15/2015 09:48  Ref. Range 10/14/2015 21:16 10/15/2015 06:37 10/15/2015 07:46  Glucose-Capillary Latest Ref Range: 65-99 mg/dL 248 (H) 310 (H) 262 (H)   Review of Glycemic Control  Diabetes history: DM2 Outpatient Diabetes medications: None Current orders for Inpatient glycemic control: Novolog 0-9 units TID with meals  Inpatient Diabetes Program Recommendations: Insulin - Basal: If steroids are continued as ordered (Solumedrol 40 mg Q6H), please consider ordering Lantus 7 units Q24H (based on 69 kg x 0.1 units). Please note if Lantus is ordered as recommended it will need to be adjusted as steroids are tapered. A1C: Please consider ordering an A1C to evaluate glycemic control over the past 2-3 months.  Thanks, Barnie Alderman, RN, MSN, CDE Diabetes Coordinator Inpatient Diabetes Program [phone] (Team Pager from [place] to [place]) [phone] ([facility] office) [phone] [facility] office) [phone] [facility] office)

## 2015-10-15 NOTE — Progress Notes (Signed)
[facility] PROGRESS NOTE  Jo Hayes [id number] DOB: [birth year] DOA: 10/14/2015   PCP: Nicola Girt, DO     Assessment/Plan: Principal Problem:   CAP (community acquired pneumonia) Active Problems:   Essential hypertension   Asthma exacerbation   Type 2 diabetes mellitus ([place])   Chronic back pain   Gout   Hypoalbuminemia   Hyponatremia   Anemia   Ineze Susi is a 82 y.o. female with medical history significant of osteoarthritis, gout, chronic back pain, hypertension, type 2 diabetes, anxiety, skin cancer of the face, who was transferred from [facility] to this facility after presenting there with progressively worse cough and shortness of breath for 2 months. Patient admitted for community-acquired pneumonia and asthma exacerbation.   Assessment and plan CAP (community acquired pneumonia) Improving, no short of breath this morning, states that Mucinex made her symptoms worse Continue supplemental oxygen. Continue bronchodilators. Continue Zithromax and Rocephin IVPB per pharmacy. Check a sputum Gram stain, culture and sensitivity.  Legionella antigen  pending and strep pneumoniae antigen negative     Asthma exacerbation Continue supplemental oxygen. Continue bronchodilators as needed. Continue Allegra 180 mg by mouth daily or equivalent formulary Continue low-dose methylprednisolone.   Essential hypertension Continue amlodipine 5 mg by mouth daily. Continue atenolol 50 mg by mouth twice a day. Continue Diovan 40 mg by mouth daily or equivalent formulary. Blood pressure monitoring.    Type 2 diabetes mellitus (HCC)-uncontrolled secondary to steroids Carbohydrate modified diet. started patient on Lantus, continue SSI CBG monitoring with regular insulin sliding scale while the patient is on glucocorticoids.   Chronic back pain Continue cyclobenzaprine 10 mg by mouth twice a day as needed for muscle spasms. Continue Norco 5/325 mg tablets  as needed for pain.   Gout Continue allopurinol 100 mg by mouth daily.   Hypoalbuminemia The patient has not been eating properly over the last 2 months due to inability to cook secondary to asthma symptoms. She is also grieving the death of her son, who died recently at home. She was the one finding him unresponsive and witnessed the code being run by 911 responders at home.    Hyponatremia/SIADH Level is minimally low and consistent with previous level from July last year. Follow-up sodium level in a.m. Expand workup if it worsens.   Anemia Anemia panel consistent with anemia of chronic disease Hemoglobin stable   DVT prophylaxsis Lovenox  Code Status:  Full code     Family Communication: Discussed in detail with the patient, all imaging results, lab results explained to the patient   Disposition Plan: Anticipate discharge in one to 2 days    Consultants:  *None  Procedures:  None  Antibiotics: Rocephin-5/29 Azithromycin-5/29        HPI/Subjective: Patient states that her breathing is much better than when she came in  Objective: Filed Vitals:   10/15/15 0142 10/15/15 0525 10/15/15 0742 10/15/15 0749  BP:  140/62  108/51  Pulse:  97  70  Temp:  97.4 F (36.3 C)  97.9 F (36.6 C)  TempSrc:  Oral  Oral  Resp:  18  16  Height:      Weight:  69.174 kg (152 lb 8 oz)    SpO2: 95% 98% 99%     Intake/Output Summary (Last 24 hours) at 10/15/15 1014 Last data filed at 10/15/15 0832  Gross per 24 hour  Intake    360 ml  Output    400 ml  Net    -  40 ml    Exam:  Examination:  General exam: Appears calm and comfortable  Respiratory: Bilateral rales. No wheezing at this time. No accessory muscle use.  Cardiovascular: Regular rate and rhythm, no murmurs / rubs / gallops. Trace lower extremity pitting edema.   2+ pedal pulses. No carotid bruits.  Gastrointestinal system: Abdomen is nondistended, soft and nontender.  No organomegaly or masses felt. Normal bowel sounds heard. Central nervous system: Alert and oriented. No focal neurological deficits. Extremities: Symmetric 5 x 5 power. Skin: No rashes, lesions or ulcers Psychiatry: Judgement and insight appear normal. Mood & affect appropriate.     Data Reviewed: I have personally reviewed following labs and imaging studies  Micro Results No results found for this or any previous visit (from the past 240 hour(s)).  Radiology Reports Dg Chest 2 View  10/14/2015  CLINICAL DATA:  Difficulty breathing for 4 days EXAM: CHEST  2 VIEW COMPARISON:  02/15/2009 FINDINGS: Cardiac shadow is within normal limits. Patchy perihilar infiltrates are noted bilaterally with some more marked right upper lobe infiltrate. No sizable effusion is seen. No bony abnormality is noted. IMPRESSION: Bilateral perihilar and right upper lobe infiltrates. Electronically Signed   By: Inez Catalina M.D.   On: 10/14/2015 15:31     CBC  Recent Labs Lab 10/14/15 1450 10/15/15 0237  WBC 11.1* 6.4  HGB 10.9* 9.3*  HCT 33.3* 29.5*  PLT 355 307  MCV 97.9 97.0  MCH 32.1 30.6  MCHC 32.7 31.5  RDW 14.3 14.5  LYMPHSABS  --  0.8  MONOABS  --  0.0*  EOSABS  --  0.0  BASOSABS  --  0.0    Chemistries   Recent Labs Lab 10/14/15 1450 10/15/15 0237  NA 133* 132*  K 4.2 4.3  CL 94* 94*  CO2 30 30  GLUCOSE 173* 363*  BUN 15 14  CREATININE 0.72 0.89  CALCIUM 8.6* 8.2*  MG  --  1.9  AST 36 27  ALT 19 18  ALKPHOS 78 63  BILITOT 0.4 0.2*   ------------------------------------------------------------------------------------------------------------------ estimated creatinine clearance is 39.4 mL/min (by C-G formula based on Cr of 0.89). ------------------------------------------------------------------------------------------------------------------ No results for input(s): HGBA1C in the last 72  hours. ------------------------------------------------------------------------------------------------------------------ No results for input(s): CHOL, HDL, LDLCALC, TRIG, CHOLHDL, LDLDIRECT in the last 72 hours. ------------------------------------------------------------------------------------------------------------------ No results for input(s): TSH, T4TOTAL, T3FREE, THYROIDAB in the last 72 hours.  Invalid input(s): FREET3 ------------------------------------------------------------------------------------------------------------------  Recent Labs  10/15/15 0237  VITAMINB12 2012*  FOLATE 21.8  FERRITIN 553*  TIBC 155*  IRON 32  RETICCTPCT 4.9*    Coagulation profile  Recent Labs Lab 10/14/15 1450  INR 1.01    No results for input(s): DDIMER in the last 72 hours.  Cardiac Enzymes  Recent Labs Lab 10/14/15 1450  TROPONINI <0.03   ------------------------------------------------------------------------------------------------------------------ Invalid input(s): POCBNP   CBG:  Recent Labs Lab 10/14/15 2116 10/15/15 0637 10/15/15 0746  GLUCAP 248* 310* 262*       Studies: Dg Chest 2 View  10/14/2015  CLINICAL DATA:  Difficulty breathing for 4 days EXAM: CHEST  2 VIEW COMPARISON:  02/15/2009 FINDINGS: Cardiac shadow is within normal limits. Patchy perihilar infiltrates are noted bilaterally with some more marked right upper lobe infiltrate. No sizable effusion is seen. No bony abnormality is noted. IMPRESSION: Bilateral perihilar and right upper lobe infiltrates. Electronically Signed   By: Inez Catalina M.D.   On: 10/14/2015 15:31      Lab Results  Component Value Date   HGBA1C 6.3*  12/11/2014   Lab Results  Component Value Date   CREATININE 0.89 10/15/2015       Scheduled Meds: . allopurinol  100 mg Oral Daily  . amLODipine  5 mg Oral Daily  . atenolol  50 mg Oral BID  . azithromycin  500 mg Intravenous Q24H  . calcium-vitamin D  1  tablet Oral TID WC  . cefTRIAXone (ROCEPHIN)  IV  1 g Intravenous Q24H  . enoxaparin (LOVENOX) injection  40 mg Subcutaneous Q24H  . insulin aspart  0-9 Units Subcutaneous TID WC  . insulin glargine  7 Units Subcutaneous Daily  . ipratropium  0.5 mg Nebulization Q6H  . irbesartan  37.5 mg Oral Daily  . loratadine  10 mg Oral Daily  . methylPREDNISolone (SOLU-MEDROL) injection  40 mg Intravenous Q6H  . sodium chloride flush  3 mL Intravenous Q12H   Continuous Infusions:    LOS: 1 day    Time spent: >30 MINS    [facility]  [facility] Pager [phone]. If 7PM-7AM, please contact night-coverage at [email or web address], password [email or web address] 10/15/2015, 10:14 AM  LOS: 1 day

## 2015-10-15 NOTE — Consult Note (Signed)
[place] spoke with RN to have literature for Advanced Directive passed on to pt.  Once complete, please contact [place] to try and coordinate notary and witnesses to complete.  Gwynn Burly 9:16 AM

## 2015-10-16 LAB — CBC WITH DIFFERENTIAL/PLATELET
BASOS ABS: 0 10*3/uL (ref 0.0–0.1)
Basophils Relative: 0 %
EOS ABS: 0 10*3/uL (ref 0.0–0.7)
EOS PCT: 0 %
HCT: 32.7 % — ABNORMAL LOW (ref 36.0–46.0)
Hemoglobin: 10.4 g/dL — ABNORMAL LOW (ref 12.0–15.0)
Lymphocytes Relative: 6 %
Lymphs Abs: 1 10*3/uL (ref 0.7–4.0)
MCH: 29.9 pg (ref 26.0–34.0)
MCHC: 31.8 g/dL (ref 30.0–36.0)
MCV: 94 fL (ref 78.0–100.0)
Monocytes Absolute: 0.5 10*3/uL (ref 0.1–1.0)
Monocytes Relative: 3 %
Neutro Abs: 14 10*3/uL — ABNORMAL HIGH (ref 1.7–7.7)
Neutrophils Relative %: 91 %
Platelets: 342 10*3/uL (ref 150–400)
RBC: 3.48 MIL/uL — AB (ref 3.87–5.11)
RDW: 14 % (ref 11.5–15.5)
WBC: 15.4 10*3/uL — AB (ref 4.0–10.5)

## 2015-10-16 LAB — GLUCOSE, CAPILLARY
GLUCOSE-CAPILLARY: 247 mg/dL — AB (ref 65–99)
GLUCOSE-CAPILLARY: 379 mg/dL — AB (ref 65–99)
Glucose-Capillary: 309 mg/dL — ABNORMAL HIGH (ref 65–99)
Glucose-Capillary: 281 mg/dL — ABNORMAL HIGH (ref 65–99)

## 2015-10-16 LAB — COMPREHENSIVE METABOLIC PANEL
ALT: 19 U/L (ref 14–54)
AST: 27 U/L (ref 15–41)
Albumin: 2.1 g/dL — ABNORMAL LOW (ref 3.5–5.0)
Alkaline Phosphatase: 66 U/L (ref 38–126)
Anion gap: 10 (ref 5–15)
BILIRUBIN TOTAL: 0.2 mg/dL — AB (ref 0.3–1.2)
BUN: 19 mg/dL (ref 6–20)
CO2: 29 mmol/L (ref 22–32)
CREATININE: 0.76 mg/dL (ref 0.44–1.00)
Calcium: 8.6 mg/dL — ABNORMAL LOW (ref 8.9–10.3)
Chloride: 88 mmol/L — ABNORMAL LOW (ref 101–111)
GFR calc Af Amer: >60 mL/min (ref >60)
Glucose, Bld: 246 mg/dL — ABNORMAL HIGH (ref 65–99)
Potassium: 4.4 mmol/L (ref 3.5–5.1)
Sodium: 127 mmol/L — ABNORMAL LOW (ref 135–145)
TOTAL PROTEIN: 5.9 g/dL — AB (ref 6.5–8.1)

## 2015-10-16 LAB — LEGIONELLA PNEUMOPHILA SEROGP 1 UR AG: L. PNEUMOPHILA SEROGP 1 UR AG: NEGATIVE

## 2015-10-16 MED ORDER — PREDNISONE 50 MG PO TABS
50.0000 mg | ORAL_TABLET | Freq: Every day | ORAL | Status: DC
  Administered 2015-10-16 – 2015-10-17 (×2): 50 mg via ORAL
  Filled 2015-10-16 (×2): qty 1

## 2015-10-16 NOTE — Progress Notes (Signed)
[facility] PROGRESS NOTE  Jo Hayes [id number] DOB: 12-Jun-1927 DOA: 10/14/2015   PCP: Nicola Girt, DO     Assessment/Plan: Principal Problem:   CAP (community acquired pneumonia) Active Problems:   Essential hypertension   Asthma exacerbation   Type 2 diabetes mellitus ([place])   Chronic back pain   Gout   Hypoalbuminemia   Hyponatremia   Anemia   Jo Hayes is a 82 y.o. female with medical history significant of osteoarthritis, gout, chronic back pain, hypertension, type 2 diabetes, anxiety, skin cancer of the face, who was transferred from [facility] to this facility after presenting there with progressively worse cough and shortness of breath for 2 months. Patient admitted for community-acquired pneumonia and asthma exacerbation.   Assessment and plan CAP (community acquired pneumonia) Improving, afebrile overnight, avoid Mucinex as patient said her symptoms became worse after Mucinex Continue supplemental oxygen. Continue bronchodilators. Continue Zithromax and Rocephin IVPB per pharmacy. Pending sputum Gram stain, culture and sensitivity.  Legionella antigen  pending and strep pneumoniae antigen negative     Asthma exacerbation-continue to taper steroids Continue supplemental oxygen. Continue bronchodilators as needed. Continue Allegra 180 mg by mouth daily or equivalent formulary Now on steroid taper   Essential hypertension Continue amlodipine 5 mg by mouth daily. Continue atenolol 50 mg by mouth twice a day. Continue Diovan 40 mg by mouth daily or equivalent formulary. Blood pressure monitoring.    Type 2 diabetes mellitus (HCC)-uncontrolled secondary to steroids Carbohydrate modified diet. started patient on Lantus, continue SSI CBG monitoring with regular insulin sliding scale while the patient is on glucocorticoids.   Chronic back pain Continue cyclobenzaprine 10 mg by mouth twice a day as needed for muscle spasms. Continue Norco  5/325 mg tablets as needed for pain.   Gout Continue allopurinol 100 mg by mouth daily.   Hypoalbuminemia The patient has not been eating properly over the last 2 months due to inability to cook secondary to asthma symptoms. She is also grieving the death of her son, who died recently at home. She was the one finding him unresponsive and witnessed the code being run by 911 responders at home.    Hyponatremia/SIADH-worsening Level is minimally low and consistent with previous level from July last year. Sodium 127, placed on fluid restriction, continue to follow Check TSH and free T4   Anemia Anemia panel consistent with anemia of chronic disease Hemoglobin stable   DVT prophylaxsis Lovenox  Code Status:  Full code     Family Communication: Discussed in detail with the patient, all imaging results, lab results explained to the patient   Disposition Plan: Anticipate discharge in one to 2 days    Consultants:  *None  Procedures:  None  Antibiotics: Rocephin-5/29 Azithromycin-5/29        HPI/Subjective: Patient states that her breathing is much better, continues to be anxious, afebrile overnight  Objective: Filed Vitals:   10/15/15 2001 10/15/15 2035 10/16/15 0521 10/16/15 0721  BP:  117/56 120/60   Pulse:  68 65 66  Temp:  97.7 F (36.5 C) 97.9 F (36.6 C)   TempSrc:  Oral Oral   Resp:  18 18 18   Height:      Weight:      SpO2: 97% 98% 93% 96%    Intake/Output Summary (Last 24 hours) at 10/16/15 0828 Last data filed at 10/16/15 0544  Gross per 24 hour  Intake    880 ml  Output   1100 ml  Net   -220 ml    Exam:  Examination:  General exam: Appears calm and comfortable  Respiratory: Bilateral rales. No wheezing at this time. No accessory muscle use.  Cardiovascular: Regular rate and rhythm, no murmurs / rubs / gallops. Trace lower extremity pitting edema.   2+ pedal pulses. No carotid bruits.   Gastrointestinal system: Abdomen is nondistended, soft and nontender. No organomegaly or masses felt. Normal bowel sounds heard. Central nervous system: Alert and oriented. No focal neurological deficits. Extremities: Symmetric 5 x 5 power. Skin: No rashes, lesions or ulcers Psychiatry: Judgement and insight appear normal. Mood & affect appropriate.     Data Reviewed: I have personally reviewed following labs and imaging studies  Micro Results No results found for this or any previous visit (from the past 240 hour(s)).  Radiology Reports Dg Chest 2 View  10/14/2015  CLINICAL DATA:  Difficulty breathing for 4 days EXAM: CHEST  2 VIEW COMPARISON:  02/15/2009 FINDINGS: Cardiac shadow is within normal limits. Patchy perihilar infiltrates are noted bilaterally with some more marked right upper lobe infiltrate. No sizable effusion is seen. No bony abnormality is noted. IMPRESSION: Bilateral perihilar and right upper lobe infiltrates. Electronically Signed   By: Inez Catalina M.D.   On: 10/14/2015 15:31     CBC  Recent Labs Lab 10/14/15 1450 10/15/15 0237 10/16/15 0622  WBC 11.1* 6.4 15.4*  HGB 10.9* 9.3* 10.4*  HCT 33.3* 29.5* 32.7*  PLT 355 307 342  MCV 97.9 97.0 94.0  MCH 32.1 30.6 29.9  MCHC 32.7 31.5 31.8  RDW 14.3 14.5 14.0  LYMPHSABS  --  0.8 1.0  MONOABS  --  0.0* 0.5  EOSABS  --  0.0 0.0  BASOSABS  --  0.0 0.0    Chemistries   Recent Labs Lab 10/14/15 1450 10/15/15 0237 10/16/15 0622  NA 133* 132* 127*  K 4.2 4.3 4.4  CL 94* 94* 88*  CO2 30 30 29   GLUCOSE 173* 363* 246*  BUN 15 14 19   CREATININE 0.72 0.89 0.76  CALCIUM 8.6* 8.2* 8.6*  MG  --  1.9  --   AST 36 27 27  ALT 19 18 19   ALKPHOS 78 63 66  BILITOT 0.4 0.2* 0.2*   ------------------------------------------------------------------------------------------------------------------ estimated creatinine clearance is 43.8 mL/min (by C-G formula based on Cr of  0.76). ------------------------------------------------------------------------------------------------------------------ No results for input(s): HGBA1C in the last 72 hours. ------------------------------------------------------------------------------------------------------------------ No results for input(s): CHOL, HDL, LDLCALC, TRIG, CHOLHDL, LDLDIRECT in the last 72 hours. ------------------------------------------------------------------------------------------------------------------ No results for input(s): TSH, T4TOTAL, T3FREE, THYROIDAB in the last 72 hours.  Invalid input(s): FREET3 ------------------------------------------------------------------------------------------------------------------  Recent Labs  10/15/15 0237  VITAMINB12 2012*  FOLATE 21.8  FERRITIN 553*  TIBC 155*  IRON 32  RETICCTPCT 4.9*    Coagulation profile  Recent Labs Lab 10/14/15 1450  INR 1.01    No results for input(s): DDIMER in the last 72 hours.  Cardiac Enzymes  Recent Labs Lab 10/14/15 1450  TROPONINI <0.03   ------------------------------------------------------------------------------------------------------------------ Invalid input(s): POCBNP   CBG:  Recent Labs Lab 10/15/15 0637 10/15/15 0746 10/15/15 1130 10/15/15 1636 10/16/15 0646  GLUCAP 310* 262* 266* 224* 247*       Studies: Dg Chest 2 View  10/14/2015  CLINICAL DATA:  Difficulty breathing for 4 days EXAM: CHEST  2 VIEW COMPARISON:  02/15/2009 FINDINGS: Cardiac shadow is within normal limits. Patchy perihilar infiltrates are noted bilaterally with some more marked right upper lobe infiltrate. No sizable effusion is seen. No bony  abnormality is noted. IMPRESSION: Bilateral perihilar and right upper lobe infiltrates. Electronically Signed   By: Inez Catalina M.D.   On: 10/14/2015 15:31      Lab Results  Component Value Date   HGBA1C 6.3* 12/11/2014   Lab Results  Component Value Date   CREATININE  0.76 10/16/2015       Scheduled Meds: . allopurinol  100 mg Oral Daily  . amLODipine  5 mg Oral Daily  . atenolol  50 mg Oral BID  . azithromycin  500 mg Intravenous Q24H  . calcium-vitamin D  1 tablet Oral TID WC  . cefTRIAXone (ROCEPHIN)  IV  1 g Intravenous Q24H  . enoxaparin (LOVENOX) injection  40 mg Subcutaneous Q24H  . insulin aspart  0-9 Units Subcutaneous TID WC  . insulin glargine  7 Units Subcutaneous Daily  . ipratropium-albuterol  3 mL Nebulization TID  . irbesartan  37.5 mg Oral Daily  . loratadine  10 mg Oral Daily  . predniSONE  50 mg Oral Q breakfast  . sodium chloride flush  3 mL Intravenous Q12H   Continuous Infusions:    LOS: 2 days    Time spent: >30 MINS    [facility]  [facility] Pager [phone]. If 7PM-7AM, please contact night-coverage at [email or web address], password [place] 10/16/2015, 8:28 AM  LOS: 2 days

## 2015-10-16 NOTE — Evaluation (Addendum)
Physical Therapy Evaluation Patient Details Name: Jo Hayes MRN: [id number] DOB: 1928/04/20 Today's Date: 10/16/2015   History of Present Illness  Brynja Glatt is a 82 y.o. female with medical history significant of osteoarthritis, gout, chronic back pain, hypertension, type 2 diabetes, anxiety, skin cancer of the face, who was transferred from [facility] to this facility after presenting there with progressively worse cough and shortness of breath for 2 months.  Clinical Impression  Pt admitted with above diagnosis. Pt currently with functional limitations due to the deficits listed below (see PT Problem List).  Pt will benefit from skilled PT to increase their independence and safety with mobility to allow discharge to the venue listed below.  Pt is close to baseline mobility.  She does report she ambulates a little every day while cruising furniture.  Pt educated on safety and use of RW, but she states "my sister died from using a walker and I will not use it."  Pt uses w/c and power chair for most mobility.  She declined ambulation today.  Reviewed her home exercise program and encouraged to continue with seated LE therex.     Follow Up Recommendations Supervision - Intermittent;No PT follow up    Equipment Recommendations  None recommended by PT    Recommendations for Other Services       Precautions / Restrictions Precautions Precautions: Fall      Mobility  Bed Mobility Overal bed mobility: Needs Assistance Bed Mobility: Supine to Sit     Supine to sit: Supervision     General bed mobility comments: Pt able to get to EOB with S, although states she sleeps in recliner  Transfers Overall transfer level: Needs assistance   Transfers: Stand Pivot Transfers;Sit to/from Stand Sit to Stand: Supervision Stand pivot transfers: Supervision       General transfer comment: Pt transferred to recliner then [facility] with S  Ambulation/Gait             General Gait Details: Pt  declined  Stairs            Wheelchair Mobility    Modified Rankin (Stroke Patients Only)       Balance Overall balance assessment: History of Falls                                           Pertinent Vitals/Pain Pain Assessment: No/denies pain    Home Living Family/patient expects to be discharged to:: Private residence Living Arrangements: Alone Available Help at Discharge: Personal care attendant (aide 6 days/week about 3 hrs/day) Type of Home: Apartment Home Access: Elevator     Home Layout: One level Home Equipment: Tub bench;Electric scooter;Wheelchair - manual;Bedside commode;Hand held shower head;Cane - single point;Walker - 4 wheels;Walker - standard      Prior Function Level of Independence: Independent with assistive device(s)         Comments: Normally uses power chair or w/c around apartment.  Walks a little bit everyday cruising furniture. Pt reports too scared to use a walker     Hand Dominance   Dominant Hand: Right    Extremity/Trunk Assessment   Upper Extremity Assessment: Overall WFL for tasks assessed           Lower Extremity Assessment: Overall WFL for tasks assessed      Cervical / Trunk Assessment: Normal  Communication   Communication: HOH  Cognition Arousal/Alertness:  Awake/alert Behavior During Therapy: WFL for tasks assessed/performed Overall Cognitive Status: Within Functional Limits for tasks assessed                      General Comments General comments (skin integrity, edema, etc.): Pt with o2 intact, but no SOB noted with SPT    Exercises        Assessment/Plan    PT Assessment Patient needs continued PT services  PT Diagnosis Generalized weakness   PT Problem List Decreased strength  PT Treatment Interventions Gait training;Functional mobility training;Therapeutic activities;Therapeutic exercise;Balance training;DME instruction   PT Goals (Current goals can be found in the  Care Plan section) Acute Rehab PT Goals Patient Stated Goal: go home PT Goal Formulation: With patient Time For Goal Achievement: 10/23/15 Potential to Achieve Goals: Good    Frequency Min 2X/week   Barriers to discharge        Co-evaluation               End of Session Equipment Utilized During Treatment: Gait belt;Oxygen Activity Tolerance: Patient tolerated treatment well Patient left: Other (comment);with call bell/phone within reach (on [facility]. Pt aware to call for A to get up.) Nurse Communication:  (NT aware pt on [facility] with call bell)         Time: 1134 (no charge for time MD was in room)-1158 PT Time Calculation (min) (ACUTE ONLY): 24 min   Charges:   PT Evaluation $PT Eval Low Complexity: 1 Procedure     PT G Codes:        [employer] LUBECK 10/16/2015, 12:54 PM

## 2015-10-16 NOTE — Care Management Important Message (Signed)
Important Message  Patient Details  Name: Jo Hayes MRN: [id number] Date of Birth: February 08, 1928   Medicare Important Message Given:  Yes    Loann Quill 10/16/2015, 9:18 AM

## 2015-10-16 NOTE — Progress Notes (Signed)
Inpatient Diabetes Program Recommendations  AACE/ADA: New Consensus Statement on Inpatient Glycemic Control (2015)  Target Ranges:  Prepandial:   less than 140 mg/dL      Peak postprandial:   less than 180 mg/dL (1-2 hours)      Critically ill patients:  140 - 180 mg/dL  Results for SAPIR, SORELL (MRN [id number]) as of 10/16/2015 07:38  Ref. Range 10/15/2015 07:46 10/15/2015 11:30 10/15/2015 16:36 10/16/2015 06:46  Glucose-Capillary Latest Ref Range: 65-99 mg/dL 262 (H) 266 (H) 224 (H) 247 (H)   Review of Glycemic Control  Diabetes history: DM2 Outpatient Diabetes medications: None Current orders for Inpatient glycemic control: Novolog 0-9 units TID with meals, Lantus 7 units daily  Inpatient Diabetes Program Recommendations: Insulin-Basal: Please consider increasing Lantus to 9 units daily if steroids are continued as ordered (Prednisone 40 mg BID). Insulin-Correction: Please consider ordering Novolog bedtime correction scale. A1C: Please consider ordering an A1C to evaluate glycemic control over the past 2-3 months.  Thanks, Barnie Alderman, RN, MSN, CDE Diabetes Coordinator Inpatient Diabetes Program [phone] (Team Pager from [place] to [place]) [phone] ([facility] office) [phone] [facility] office) [phone] [facility] office)

## 2015-10-17 DIAGNOSIS — M549 Dorsalgia, unspecified: Secondary | ICD-10-CM

## 2015-10-17 DIAGNOSIS — G8929 Other chronic pain: Secondary | ICD-10-CM

## 2015-10-17 LAB — GLUCOSE, CAPILLARY
GLUCOSE-CAPILLARY: 199 mg/dL — AB (ref 65–99)
GLUCOSE-CAPILLARY: 338 mg/dL — AB (ref 65–99)

## 2015-10-17 LAB — CBC WITH DIFFERENTIAL/PLATELET
Basophils Absolute: 0 10*3/uL (ref 0.0–0.1)
Basophils Relative: 0 %
EOS PCT: 0 %
Eosinophils Absolute: 0 10*3/uL (ref 0.0–0.7)
HEMATOCRIT: 33.3 % — AB (ref 36.0–46.0)
Hemoglobin: 10.8 g/dL — ABNORMAL LOW (ref 12.0–15.0)
LYMPHS ABS: 1 10*3/uL (ref 0.7–4.0)
LYMPHS PCT: 7 %
MCH: 30.3 pg (ref 26.0–34.0)
MCHC: 32.4 g/dL (ref 30.0–36.0)
MCV: 93.3 fL (ref 78.0–100.0)
MONO ABS: 0.4 10*3/uL (ref 0.1–1.0)
Monocytes Relative: 3 %
NEUTROS ABS: 12.9 10*3/uL — AB (ref 1.7–7.7)
Neutrophils Relative %: 90 %
PLATELETS: 371 10*3/uL (ref 150–400)
RBC: 3.57 MIL/uL — AB (ref 3.87–5.11)
RDW: 14 % (ref 11.5–15.5)
WBC: 14.3 10*3/uL — AB (ref 4.0–10.5)

## 2015-10-17 LAB — COMPREHENSIVE METABOLIC PANEL
ALBUMIN: 2 g/dL — AB (ref 3.5–5.0)
ALT: 19 U/L (ref 14–54)
ANION GAP: 7 (ref 5–15)
AST: 23 U/L (ref 15–41)
Alkaline Phosphatase: 64 U/L (ref 38–126)
BUN: 23 mg/dL — AB (ref 6–20)
CHLORIDE: 89 mmol/L — AB (ref 101–111)
CO2: 32 mmol/L (ref 22–32)
Calcium: 9.3 mg/dL (ref 8.9–10.3)
Creatinine, Ser: 0.79 mg/dL (ref 0.44–1.00)
GFR calc Af Amer: >60 mL/min (ref >60)
GFR calc non Af Amer: >60 mL/min (ref >60)
GLUCOSE: 228 mg/dL — AB (ref 65–99)
POTASSIUM: 4.8 mmol/L (ref 3.5–5.1)
Sodium: 128 mmol/L — ABNORMAL LOW (ref 135–145)
Total Bilirubin: 0.1 mg/dL — ABNORMAL LOW (ref 0.3–1.2)
Total Protein: 5.8 g/dL — ABNORMAL LOW (ref 6.5–8.1)

## 2015-10-17 LAB — T4, FREE: Free T4: 1.01 ng/dL (ref 0.61–1.12)

## 2015-10-17 LAB — TSH: TSH: 1.159 u[IU]/mL (ref 0.350–4.500)

## 2015-10-17 MED ORDER — GLIPIZIDE 5 MG PO TABS: 2.5000 mg | ORAL_TABLET | Freq: Two times a day (BID) | ORAL | Status: AC

## 2015-10-17 MED ORDER — CEFDINIR 300 MG PO CAPS: 300.0000 mg | ORAL_CAPSULE | Freq: Two times a day (BID) | ORAL | Status: DC

## 2015-10-17 MED ORDER — PREDNISONE 50 MG PO TABS: 50.0000 mg | ORAL_TABLET | Freq: Every day | ORAL | Status: DC

## 2015-10-17 MED ORDER — AZITHROMYCIN 500 MG PO TABS: 500.0000 mg | ORAL_TABLET | Freq: Every day | ORAL | Status: DC

## 2015-10-17 MED ORDER — GLIPIZIDE 2.5 MG HALF TABLET
2.5000 mg | ORAL_TABLET | Freq: Two times a day (BID) | ORAL | Status: DC
  Administered 2015-10-17: 2.5 mg via ORAL
  Filled 2015-10-17 (×2): qty 1

## 2015-10-17 NOTE — Progress Notes (Signed)
SATURATION QUALIFICATIONS: (This note is used to comply with regulatory documentation for home oxygen)  Patient Saturations on Room Air at Rest = 87%  Patient Saturations on Room Air while Ambulating = 85%  Patient Saturations on 2 Liters of oxygen while Ambulating = 100%  Please briefly explain why patient needs home oxygen: Pt desats to 85% on RA while ambulating.

## 2015-10-17 NOTE — Discharge Summary (Addendum)
Physician Discharge Summary  Jo Hayes MRN: [id number] DOB/AGE: 80-Jul-1929 80 y.o.  PCP: Leola Brazil, DO   Admit date: 10/14/2015 Discharge date: 10/17/2015  Discharge Diagnoses:     Principal Problem:   CAP (community acquired pneumonia) Active Problems:   Essential hypertension   Asthma exacerbation   Type 2 diabetes mellitus (HCC)   Chronic back pain   Gout   Hypoalbuminemia   Hyponatremia   Anemia    Follow-up recommendations Follow-up with PCP in 3-5 days , including all  additional recommended appointments as below Follow-up CBC, CMP in 3-5 days patient would benefit from outpatient psychiatry consultation. For  Depression and grief counseling for recent loss of her son in close family members Recommend outpatient hemoglobin A1c    Current Discharge Medication List    START taking these medications   Details  azithromycin (ZITHROMAX) 500 MG tablet Take 1 tablet (500 mg total) by mouth daily. Qty: 5 tablet, Refills: 0    cefdinir (OMNICEF) 300 MG capsule Take 1 capsule (300 mg total) by mouth 2 (two) times daily. Qty: 10 capsule, Refills: 0    predniSONE (DELTASONE) 50 MG tablet Take 1 tablet (50 mg total) by mouth daily with breakfast. Qty: 3 tablet, Refills: 0      CONTINUE these medications which have NOT CHANGED   Details  albuterol (PROAIR HFA) 108 (90 Base) MCG/ACT inhaler Inhale 2 puffs into the lungs every 4 (four) hours as needed for wheezing or shortness of breath.     allopurinol (ZYLOPRIM) 100 MG tablet Take 100 mg by mouth daily.    amLODipine (NORVASC) 5 MG tablet Take 5 mg by mouth daily.    aspirin (GOODSENSE ASPIRIN) 325 MG tablet Take 325 mg by mouth daily.    atenolol (TENORMIN) 50 MG tablet Take 50 mg by mouth 2 (two) times daily.    Calcium Carbonate-Vitamin D (CALCIUM 600+D) 600-200 MG-UNIT TABS Take 1 tablet by mouth 2 (two) times daily.    clonazePAM (KLONOPIN) 1 MG tablet Take 1 mg by mouth every 12 (twelve) hours as needed  for anxiety.     diclofenac sodium (VOLTAREN) 1 % GEL Apply 4 g topically 2 (two) times daily as needed (for knee pain).     fexofenadine (ALLEGRA) 180 MG tablet Take 180 mg by mouth daily.    fluticasone (FLONASE) 50 MCG/ACT nasal spray Place 2 sprays into the nose daily.    hydroxypropyl methylcellulose / hypromellose (ISOPTO TEARS / GONIOVISC) 2.5 % ophthalmic solution Place 1 drop into both eyes daily as needed for dry eyes.    Multiple Vitamin (MULTIVITAMIN) capsule Take 1 capsule by mouth daily.    valsartan (DIOVAN) 40 MG tablet Take 40 mg by mouth 2 (two) times daily.      STOP taking these medications     Calcium Carbonate-Vitamin D (CALCIUM + D PO)      cyclobenzaprine (FLEXERIL) 10 MG tablet      HYDROcodone-acetaminophen (NORCO) 5-325 MG per tablet          Discharge Condition: Stable   Discharge Instructions Get Medicines reviewed and adjusted: Please take all your medications with you for your next visit with your Primary MD  Please request your Primary MD to go over all hospital tests and procedure/radiological results at the follow up, please ask your Primary MD to get all Hospital records sent to his/her office.  If you experience worsening of your admission symptoms, develop shortness of breath, life threatening emergency, suicidal or homicidal thoughts  you must seek medical attention immediately by calling 911 or calling your MD immediately if symptoms less severe.  You must read complete instructions/literature along with all the possible adverse reactions/side effects for all the Medicines you take and that have been prescribed to you. Take any new Medicines after you have completely understood and accpet all the possible adverse reactions/side effects.   Do not drive when taking Pain medications.   Do not take more than prescribed Pain, Sleep and Anxiety Medications  Special Instructions: If you have smoked or chewed Tobacco in the last 2 yrs please  stop smoking, stop any regular Alcohol and or any Recreational drug use.  Wear Seat belts while driving.  Please note  You were cared for by a hospitalist during your hospital stay. Once you are discharged, your primary care physician will handle any further medical issues. Please note that NO REFILLS for any discharge medications will be authorized once you are discharged, as it is imperative that you return to your primary care physician (or establish a relationship with a primary care physician if you do not have one) for your aftercare needs so that they can reassess your need for medications and monitor your lab values.     Allergies  Allergen Reactions  . Avelox [Moxifloxacin Hcl In Nacl] Other (See Comments)    Other reaction(s): Hallucinations Jittery "nervous wreck"  . Hydrochlorothiazide Other (See Comments)    Other reaction(s): Other Urinary frequency "pee too much"  . Sulfa Antibiotics Other (See Comments) and Hives    Face flushed red  . Clemastine     Other reaction(s): Other (See Comments) unknown  . Colchicine Diarrhea  . Demerol [Meperidine] Other (See Comments)    hallucination  . Duloxetine     Other reaction(s): Dizziness (intolerance)  . Hydrochlorothiazide W-Triamterene     Other reaction(s): Dizziness (intolerance)  . Sulfamethoxazole     Other reaction(s): Other (See Comments) Facial swelling      Disposition: 01-Home or Self Care   Consults:  None     Significant Diagnostic Studies:  Dg Chest 2 View  10/14/2015  CLINICAL DATA:  Difficulty breathing for 4 days EXAM: CHEST  2 VIEW COMPARISON:  02/15/2009 FINDINGS: Cardiac shadow is within normal limits. Patchy perihilar infiltrates are noted bilaterally with some more marked right upper lobe infiltrate. No sizable effusion is seen. No bony abnormality is noted. IMPRESSION: Bilateral perihilar and right upper lobe infiltrates. Electronically Signed   By: Alcide Clever M.D.   On: 10/14/2015 15:31         Filed Weights   10/14/15 1841 10/15/15 0525 10/17/15 0621  Weight: 68.992 kg (152 lb 1.6 oz) 69.174 kg (152 lb 8 oz) 72.485 kg (159 lb 12.8 oz)     Microbiology: No results found for this or any previous visit (from the past 240 hour(s)).     Blood Culture No results found for: SDES, SPECREQUEST, CULT, REPTSTATUS    Labs: Results for orders placed or performed during the hospital encounter of 10/14/15 (from the past 48 hour(s))  Glucose, capillary     Status: Abnormal   Collection Time: 10/15/15 11:30 AM  Result Value Ref Range   Glucose-Capillary 266 (H) 65 - 99 mg/dL  Glucose, capillary     Status: Abnormal   Collection Time: 10/15/15  4:36 PM  Result Value Ref Range   Glucose-Capillary 224 (H) 65 - 99 mg/dL  Comprehensive metabolic panel     Status: Abnormal   Collection Time: 10/16/15  6:22 AM  Result Value Ref Range   Sodium 127 (L) 135 - 145 mmol/L   Potassium 4.4 3.5 - 5.1 mmol/L   Chloride 88 (L) 101 - 111 mmol/L   CO2 29 22 - 32 mmol/L   Glucose, Bld 246 (H) 65 - 99 mg/dL   BUN 19 6 - 20 mg/dL   Creatinine, Ser 0.76 0.44 - 1.00 mg/dL   Calcium 8.6 (L) 8.9 - 10.3 mg/dL   Total Protein 5.9 (L) 6.5 - 8.1 g/dL   Albumin 2.1 (L) 3.5 - 5.0 g/dL   AST 27 15 - 41 U/L   ALT 19 14 - 54 U/L   Alkaline Phosphatase 66 38 - 126 U/L   Total Bilirubin 0.2 (L) 0.3 - 1.2 mg/dL   GFR calc non Af Amer >60 >60 mL/min   GFR calc Af Amer >60 >60 mL/min    Comment: (NOTE) The eGFR has been calculated using the CKD EPI equation. This calculation has not been validated in all clinical situations. eGFR's persistently <60 mL/min signify possible Chronic Kidney Disease.    Anion gap 10 5 - 15  CBC WITH DIFFERENTIAL     Status: Abnormal   Collection Time: 10/16/15  6:22 AM  Result Value Ref Range   WBC 15.4 (H) 4.0 - 10.5 K/uL   RBC 3.48 (L) 3.87 - 5.11 MIL/uL   Hemoglobin 10.4 (L) 12.0 - 15.0 g/dL   HCT 32.7 (L) 36.0 - 46.0 %   MCV 94.0 78.0 - 100.0 fL   MCH 29.9  26.0 - 34.0 pg   MCHC 31.8 30.0 - 36.0 g/dL   RDW 14.0 11.5 - 15.5 %   Platelets 342 150 - 400 K/uL   Neutrophils Relative % 91 %   Neutro Abs 14.0 (H) 1.7 - 7.7 K/uL   Lymphocytes Relative 6 %   Lymphs Abs 1.0 0.7 - 4.0 K/uL   Monocytes Relative 3 %   Monocytes Absolute 0.5 0.1 - 1.0 K/uL   Eosinophils Relative 0 %   Eosinophils Absolute 0.0 0.0 - 0.7 K/uL   Basophils Relative 0 %   Basophils Absolute 0.0 0.0 - 0.1 K/uL  Glucose, capillary     Status: Abnormal   Collection Time: 10/16/15  6:46 AM  Result Value Ref Range   Glucose-Capillary 247 (H) 65 - 99 mg/dL  Glucose, capillary     Status: Abnormal   Collection Time: 10/16/15 12:24 PM  Result Value Ref Range   Glucose-Capillary 309 (H) 65 - 99 mg/dL   Comment 1 Notify RN    Comment 2 Document in Chart   Glucose, capillary     Status: Abnormal   Collection Time: 10/16/15  4:38 PM  Result Value Ref Range   Glucose-Capillary 379 (H) 65 - 99 mg/dL   Comment 1 Notify RN    Comment 2 Document in Chart   Glucose, capillary     Status: Abnormal   Collection Time: 10/16/15  9:00 PM  Result Value Ref Range   Glucose-Capillary 281 (H) 65 - 99 mg/dL   Comment 1 Notify RN    Comment 2 Document in Chart   Comprehensive metabolic panel     Status: Abnormal   Collection Time: 10/17/15  3:50 AM  Result Value Ref Range   Sodium 128 (L) 135 - 145 mmol/L   Potassium 4.8 3.5 - 5.1 mmol/L   Chloride 89 (L) 101 - 111 mmol/L   CO2 32 22 - 32 mmol/L   Glucose, Bld 228 (  H) 65 - 99 mg/dL   BUN 23 (H) 6 - 20 mg/dL   Creatinine, Ser 0.79 0.44 - 1.00 mg/dL   Calcium 9.3 8.9 - 10.3 mg/dL   Total Protein 5.8 (L) 6.5 - 8.1 g/dL   Albumin 2.0 (L) 3.5 - 5.0 g/dL   AST 23 15 - 41 U/L   ALT 19 14 - 54 U/L   Alkaline Phosphatase 64 38 - 126 U/L   Total Bilirubin 0.1 (L) 0.3 - 1.2 mg/dL   GFR calc non Af Amer >60 >60 mL/min   GFR calc Af Amer >60 >60 mL/min    Comment: (NOTE) The eGFR has been calculated using the CKD EPI equation. This  calculation has not been validated in all clinical situations. eGFR's persistently <60 mL/min signify possible Chronic Kidney Disease.    Anion gap 7 5 - 15  CBC WITH DIFFERENTIAL     Status: Abnormal   Collection Time: 10/17/15  3:50 AM  Result Value Ref Range   WBC 14.3 (H) 4.0 - 10.5 K/uL   RBC 3.57 (L) 3.87 - 5.11 MIL/uL   Hemoglobin 10.8 (L) 12.0 - 15.0 g/dL   HCT 33.3 (L) 36.0 - 46.0 %   MCV 93.3 78.0 - 100.0 fL   MCH 30.3 26.0 - 34.0 pg   MCHC 32.4 30.0 - 36.0 g/dL   RDW 14.0 11.5 - 15.5 %   Platelets 371 150 - 400 K/uL   Neutrophils Relative % 90 %   Neutro Abs 12.9 (H) 1.7 - 7.7 K/uL   Lymphocytes Relative 7 %   Lymphs Abs 1.0 0.7 - 4.0 K/uL   Monocytes Relative 3 %   Monocytes Absolute 0.4 0.1 - 1.0 K/uL   Eosinophils Relative 0 %   Eosinophils Absolute 0.0 0.0 - 0.7 K/uL   Basophils Relative 0 %   Basophils Absolute 0.0 0.0 - 0.1 K/uL  TSH     Status: None   Collection Time: 10/17/15  3:50 AM  Result Value Ref Range   TSH 1.159 0.350 - 4.500 uIU/mL  T4, free     Status: None   Collection Time: 10/17/15  3:50 AM  Result Value Ref Range   Free T4 1.01 0.61 - 1.12 ng/dL  Glucose, capillary     Status: Abnormal   Collection Time: 10/17/15  6:05 AM  Result Value Ref Range   Glucose-Capillary 199 (H) 65 - 99 mg/dL     Lipid Panel  No results found for: CHOL, TRIG, HDL, CHOLHDL, VLDL, LDLCALC, LDLDIRECT  Elanda Marquina is a 82 y.o. female with medical history significant of osteoarthritis, gout, chronic back pain, hypertension, type 2 diabetes, anxiety, skin cancer of the face, who was transferred from [facility] to this facility after presenting there with progressively worse cough and shortness of breath for 2 months. Patient admitted for community-acquired pneumonia and asthma exacerbation.   Hospital course CAP (community acquired pneumonia) Improving, afebrile overnight, avoid Mucinex as patient said her symptoms became worse after Mucinex Continue supplemental  oxygen. Assess for home oxygen needs Continue bronchodilators. Treated with Zithromax and Rocephin, now switched to azithromycin and Omnicef 5 more days Pending sputum Gram stain, culture and sensitivity. Legionella antigen pending and strep pneumoniae antigen negative    Asthma exacerbation-continue to taper steroids Continue supplemental oxygen. Continue bronchodilators as needed. Continue Allegra 180 mg by mouth daily or equivalent formulary Now on steroid taper, continue prednisone 50 mg a day 3 more days   Essential hypertension Continue amlodipine 5 mg by  mouth daily. Continue atenolol 50 mg by mouth twice a day. Continue Diovan 40 mg by mouth daily or equivalent formulary. Blood pressure monitoring.    Type 2 diabetes mellitus (HCC)-uncontrolled secondary to steroids Carbohydrate modified diet. Patient received 1 dose of Lantus and sliding scale insulin CBG monitoring with regular insulin sliding scale while the patient is on glucocorticoids. Recommend repeat hemoglobin A1c We'll start the patient on glipizide for type 2 diabetes   Chronic back pain Continue cyclobenzaprine 10 mg by mouth twice a day as needed for muscle spasms. Continue Norco 5/325 mg tablets as needed for pain.   Gout Continue allopurinol 100 mg by mouth daily.   Hypoalbuminemia The patient has not been eating properly over the last 2 months due to inability to cook secondary to asthma symptoms. She is also grieving the death of her son, who died recently at home. She was the one finding him unresponsive and witnessed the code being run by 911 responders at home.    Hyponatremia/SIADH-sodium 128 on the day of discharge Level is minimally low and consistent with previous level from July last year. She ranges between 132-134   placed on fluid restriction, continue to follow TSH and free T4 is normal   Anemia Anemia panel consistent with anemia of chronic disease Hemoglobin  stable        Discharge Exam:   Blood pressure 140/55, pulse 62, temperature 97.6 F (36.4 C), temperature source Oral, resp. rate 18, height 5' (1.524 m), weight 72.485 kg (159 lb 12.8 oz), SpO2 100 %.  General exam: Appears calm and comfortable  Respiratory: Bilateral rales. No wheezing at this time. No accessory muscle use.  Cardiovascular: Regular rate and rhythm, no murmurs / rubs / gallops. Trace lower extremity pitting edema.   2+ pedal pulses. No carotid bruits.  Gastrointestinal system: Abdomen is nondistended, soft and nontender. No organomegaly or masses felt. Normal bowel sounds heard. Central nervous system: Alert and oriented. No focal neurological deficits. Extremities: Symmetric 5 x 5 power. Skin: No rashes, lesions or ulcers Psychiatry: Judgement and insight appear normal. Mood & affect appropriate.     Follow-up Information    Follow up with Doug Sou B, DO. Schedule an appointment as soon as possible for a visit in 3 days.   Specialty:  Internal Medicine   Contact information:   [address] [place] [address] [phone]       Signed: Reyne Dumas 10/17/2015, 8:22 AM        Time spent >45 mins

## 2015-10-17 NOTE — Progress Notes (Signed)
Orders received for pt discharge.  Discharge summary printed and reviewed with pt.  Explained medication regimen, and pt had no further questions at this time.  IV removed and site remains clean, dry, intact.  Telemetry removed.  Pt in stable condition and awaiting transport. 

## 2015-10-17 NOTE — Progress Notes (Signed)
Home oxygen ordered as requested. [facility] to deliver a portable oxygen tank to the hospital for discharge and will deliver more oxygen tanks once the patient is home. Mindi Slicker [facility] [phone]

## 2015-12-26 ENCOUNTER — Emergency Department (HOSPITAL_BASED_OUTPATIENT_CLINIC_OR_DEPARTMENT_OTHER)
Admission: EM | Admit: 2015-12-26 | Discharge: 2015-12-26 | Disposition: A | Discharge status: Home / Self Care | Payer: Medicare Other | Attending: Emergency Medicine | Admitting: Emergency Medicine

## 2015-12-26 ENCOUNTER — Encounter (HOSPITAL_BASED_OUTPATIENT_CLINIC_OR_DEPARTMENT_OTHER): Payer: Self-pay | Admitting: *Deleted

## 2015-12-26 ENCOUNTER — Emergency Department (HOSPITAL_BASED_OUTPATIENT_CLINIC_OR_DEPARTMENT_OTHER): Payer: Medicare Other

## 2015-12-26 DIAGNOSIS — I10 Essential (primary) hypertension: Secondary | ICD-10-CM | POA: Insufficient documentation

## 2015-12-26 DIAGNOSIS — Z7982 Long term (current) use of aspirin: Secondary | ICD-10-CM | POA: Diagnosis not present

## 2015-12-26 DIAGNOSIS — Z85828 Personal history of other malignant neoplasm of skin: Secondary | ICD-10-CM | POA: Insufficient documentation

## 2015-12-26 DIAGNOSIS — E119 Type 2 diabetes mellitus without complications: Secondary | ICD-10-CM | POA: Insufficient documentation

## 2015-12-26 DIAGNOSIS — Z7984 Long term (current) use of oral hypoglycemic drugs: Secondary | ICD-10-CM | POA: Insufficient documentation

## 2015-12-26 DIAGNOSIS — Z79899 Other long term (current) drug therapy: Secondary | ICD-10-CM | POA: Insufficient documentation

## 2015-12-26 DIAGNOSIS — Z7951 Long term (current) use of inhaled steroids: Secondary | ICD-10-CM | POA: Insufficient documentation

## 2015-12-26 DIAGNOSIS — J45909 Unspecified asthma, uncomplicated: Secondary | ICD-10-CM | POA: Diagnosis not present

## 2015-12-26 DIAGNOSIS — R1032 Left lower quadrant pain: Secondary | ICD-10-CM | POA: Diagnosis present

## 2015-12-26 LAB — CBC WITH DIFFERENTIAL/PLATELET
Basophils Absolute: 0 10*3/uL (ref 0.0–0.1)
Basophils Relative: 0 %
Eosinophils Absolute: 0.1 10*3/uL (ref 0.0–0.7)
Eosinophils Relative: 2 %
HCT: 40.1 % (ref 36.0–46.0)
HEMOGLOBIN: 13.1 g/dL (ref 12.0–15.0)
LYMPHS ABS: 2.1 10*3/uL (ref 0.7–4.0)
LYMPHS PCT: 23 %
MCH: 31.5 pg (ref 26.0–34.0)
MCHC: 32.7 g/dL (ref 30.0–36.0)
MCV: 96.4 fL (ref 78.0–100.0)
Monocytes Absolute: 0.5 10*3/uL (ref 0.1–1.0)
Monocytes Relative: 5 %
NEUTROS PCT: 70 %
Neutro Abs: 6.5 10*3/uL (ref 1.7–7.7)
Platelets: 191 10*3/uL (ref 150–400)
RBC: 4.16 MIL/uL (ref 3.87–5.11)
RDW: 13.2 % (ref 11.5–15.5)
WBC: 9.3 10*3/uL (ref 4.0–10.5)

## 2015-12-26 LAB — COMPREHENSIVE METABOLIC PANEL
ALT: 14 U/L (ref 14–54)
ANION GAP: 7 (ref 5–15)
AST: 25 U/L (ref 15–41)
Albumin: 3.7 g/dL (ref 3.5–5.0)
Alkaline Phosphatase: 68 U/L (ref 38–126)
BUN: 13 mg/dL (ref 6–20)
CHLORIDE: 99 mmol/L — AB (ref 101–111)
CO2: 33 mmol/L — AB (ref 22–32)
Calcium: 9.6 mg/dL (ref 8.9–10.3)
Creatinine, Ser: 0.62 mg/dL (ref 0.44–1.00)
GFR calc non Af Amer: >60 mL/min (ref >60)
Glucose, Bld: 166 mg/dL — ABNORMAL HIGH (ref 65–99)
Potassium: 3.8 mmol/L (ref 3.5–5.1)
SODIUM: 139 mmol/L (ref 135–145)
Total Bilirubin: 0.6 mg/dL (ref 0.3–1.2)
Total Protein: 7.8 g/dL (ref 6.5–8.1)

## 2015-12-26 LAB — URINALYSIS, ROUTINE W REFLEX MICROSCOPIC
Bilirubin Urine: NEGATIVE
GLUCOSE, UA: NEGATIVE mg/dL
HGB URINE DIPSTICK: NEGATIVE
Ketones, ur: NEGATIVE mg/dL
Leukocytes, UA: NEGATIVE
Nitrite: NEGATIVE
Protein, ur: NEGATIVE mg/dL
SPECIFIC GRAVITY, URINE: 1.011 (ref 1.005–1.030)
pH: 7.5 (ref 5.0–8.0)

## 2015-12-26 LAB — LIPASE, BLOOD: Lipase: 19 U/L (ref 11–51)

## 2015-12-26 MED ORDER — SODIUM CHLORIDE 0.9 % IV SOLN
INTRAVENOUS | Status: DC
  Administered 2015-12-26: 13:00:00 via INTRAVENOUS

## 2015-12-26 MED ORDER — SODIUM CHLORIDE 0.9 % IV BOLUS (SEPSIS)
500.0000 mL | Freq: Once | INTRAVENOUS | Status: AC
  Administered 2015-12-26: 500 mL via INTRAVENOUS

## 2015-12-26 MED ORDER — FENTANYL CITRATE (PF) 100 MCG/2ML IJ SOLN
12.5000 ug | Freq: Once | INTRAMUSCULAR | Status: AC
  Administered 2015-12-26: 12.5 ug via INTRAVENOUS
  Filled 2015-12-26: qty 2

## 2015-12-26 MED ORDER — IOPAMIDOL (ISOVUE-300) INJECTION 61%
100.0000 mL | Freq: Once | INTRAVENOUS | Status: AC | PRN
  Administered 2015-12-26: 100 mL via INTRAVENOUS

## 2015-12-26 MED ORDER — TRAMADOL HCL 50 MG PO TABS
50.0000 mg | ORAL_TABLET | Freq: Four times a day (QID) | ORAL | 0 refills | Status: AC | PRN
Start: 2015-12-26 — End: ?

## 2015-12-26 MED ORDER — ONDANSETRON HCL 4 MG/2ML IJ SOLN
4.0000 mg | Freq: Once | INTRAMUSCULAR | Status: AC
  Administered 2015-12-26: 4 mg via INTRAVENOUS
  Filled 2015-12-26: qty 2

## 2015-12-26 MED FILL — traMADol HCL 50 MG TABS: 50 | 4 days supply | Qty: 15 | Fill #0

## 2015-12-26 NOTE — ED Triage Notes (Signed)
C/o lower left abd pain, thinks she may have constipation. Pt takes mirilax regularly. States she had a BM las pm.

## 2015-12-26 NOTE — Discharge Instructions (Signed)
Follow-up with your regular doctor. Take the tramadol as directed. Return for any new or worse symptoms.

## 2015-12-26 NOTE — ED Provider Notes (Signed)
[facility] Provider Note   CSN: [id number] Arrival date & time: 12/26/15  [id number]  First Provider Contact:  First MD Initiated Contact with Patient 12/26/15 1005        History   Chief Complaint Chief Complaint  Patient presents with  . Abdominal Pain    HPI Jo Hayes is a 82 y.o. female.   Patient with a complaint of left lower quadrant abdominal pain intermittently for the past week but worse this morning. Not associated with nausea vomiting or diarrhea. Patient was concerned that she had recurrent constipation. She used to be on [place] asked but has not taken any lately. No fevers. No dysuria. Pain currently is like 5 out of 10 left lower quadrant.      Past Medical History:  Diagnosis Date  . Anxiety   . Arthritis   . Asthma   . Blood transfusion without reported diagnosis   . Diabetes mellitus without complication ([place])   . Fall at home    hx of falls  . Gout   . Hypertension   . Skin cancer of face     Patient Active Problem List   Diagnosis Date Noted  . CAP (community acquired pneumonia) 10/14/2015  . Essential hypertension 10/14/2015  . Asthma exacerbation 10/14/2015  . Type 2 diabetes mellitus ([place]) 10/14/2015  . Chronic back pain 10/14/2015  . Gout 10/14/2015  . Hypoalbuminemia 10/14/2015  . Hyponatremia 10/14/2015  . Anemia 10/14/2015  . HNP (herniated nucleus pulposus), cervical 12/14/2014    Past Surgical History:  Procedure Laterality Date  . ABDOMINAL HYSTERECTOMY    . ANKLE SURGERY    . ANTERIOR CERVICAL DECOMP/DISCECTOMY FUSION N/A 12/14/2014   Procedure: ANTERIOR CERVICAL DECOMPRESSION/DISCECTOMY FUSION  CERVICALFOUR-FIVE WITH PLATING BONEGRAFT;  Surgeon: Ashok Pall, MD;  Location: [facility];  Service: Neurosurgery;  Laterality: N/A;  . BACK SURGERY    . CHOLECYSTECTOMY    . SKIN BIOPSY    . TONSILLECTOMY      OB History    No data available       Home Medications    Prior to Admission medications     Medication Sig Start Date End Date Taking? Authorizing Provider  albuterol (PROAIR HFA) 108 (90 Base) MCG/ACT inhaler Inhale 2 puffs into the lungs every 4 (four) hours as needed for wheezing or shortness of breath.  07/05/15  Yes Historical Provider, MD  allopurinol (ZYLOPRIM) 100 MG tablet Take 100 mg by mouth daily.   Yes Historical Provider, MD  amLODipine (NORVASC) 5 MG tablet Take 5 mg by mouth daily.   Yes Historical Provider, MD  aspirin (GOODSENSE ASPIRIN) 325 MG tablet Take 325 mg by mouth daily.   Yes Historical Provider, MD  atenolol (TENORMIN) 50 MG tablet Take 50 mg by mouth 2 (two) times daily.   Yes Historical Provider, MD  clonazePAM (KLONOPIN) 1 MG tablet Take 1 mg by mouth every 12 (twelve) hours as needed for anxiety.  09/25/15  Yes Historical Provider, MD  diclofenac sodium (VOLTAREN) 1 % GEL Apply 4 g topically 2 (two) times daily as needed (for knee pain).  10/22/14  Yes Historical Provider, MD  fexofenadine (ALLEGRA) 180 MG tablet Take 180 mg by mouth daily.   Yes Historical Provider, MD  glipiZIDE (GLUCOTROL) 5 MG tablet Take 0.5 tablets (2.5 mg total) by mouth 2 (two) times daily before a meal. 10/17/15  Yes Reyne Dumas, MD  montelukast (SINGULAIR) 10 MG tablet Take 10 mg by mouth at bedtime.  Yes Historical Provider, MD  valsartan (DIOVAN) 40 MG tablet Take 40 mg by mouth 2 (two) times daily. 09/27/15  Yes Historical Provider, MD  traMADol (ULTRAM) 50 MG tablet Take 1 tablet (50 mg total) by mouth every 6 (six) hours as needed. 12/26/15   Fredia Sorrow, MD    Family History No family history on file.  Social History Social History  Substance Use Topics  . Smoking status: Never Smoker  . Smokeless tobacco: Never Used  . Alcohol use No     Allergies   Avelox [moxifloxacin hcl in nacl]; Hydrochlorothiazide; Sulfa antibiotics; Clemastine; Colchicine; Demerol [meperidine]; Duloxetine; Hydrochlorothiazide w-triamterene; and Sulfamethoxazole   Review of  Systems Review of Systems  Constitutional: Negative for fever.  HENT: Negative for congestion.   Eyes: Negative for visual disturbance.  Respiratory: Negative for shortness of breath.   Cardiovascular: Negative for chest pain.  Gastrointestinal: Positive for abdominal pain. Negative for nausea and vomiting.  Genitourinary: Negative for dysuria.  Musculoskeletal: Negative for back pain.  Skin: Negative for rash.  Neurological: Negative for headaches.  Hematological: Does not bruise/bleed easily.  Psychiatric/Behavioral: Negative for confusion.     Physical Exam Updated Vital Signs BP 157/77 (BP Location: Right Arm)   Pulse 86   Temp 97.5 F (36.4 C) (Oral)   Resp 18   Ht 4\' 11"  (1.499 m)   Wt 71.7 kg   SpO2 100%   BMI 31.91 kg/m   Physical Exam  Constitutional: She is oriented to person, place, and time. She appears well-developed and well-nourished. No distress.  HENT:  Head: Normocephalic and atraumatic.  Mouth/Throat: Oropharynx is clear and moist.  Eyes: EOM are normal. Pupils are equal, round, and reactive to light.  Neck: Normal range of motion. Neck supple.  Cardiovascular: Normal rate, regular rhythm and normal heart sounds.   Pulmonary/Chest: Effort normal and breath sounds normal. No respiratory distress.  Abdominal: Soft. Bowel sounds are normal. There is no tenderness. There is no guarding.  Neurological: She is alert and oriented to person, place, and time. No cranial nerve deficit. She exhibits normal muscle tone. Coordination normal.  Skin: Skin is warm.  Nursing note and vitals reviewed.    ED Treatments / Results  Labs (all labs ordered are listed, but only abnormal results are displayed) Labs Reviewed  COMPREHENSIVE METABOLIC PANEL - Abnormal; Notable for the following:       Result Value   Chloride 99 (*)    CO2 33 (*)    Glucose, Bld 166 (*)    All other components within normal limits  URINALYSIS, ROUTINE W REFLEX MICROSCOPIC (NOT AT [facility]) -  Abnormal; Notable for the following:    APPearance CLOUDY (*)    All other components within normal limits  LIPASE, BLOOD  CBC WITH DIFFERENTIAL/PLATELET   Results for orders placed or performed during the hospital encounter of 12/26/15  Comprehensive metabolic panel  Result Value Ref Range   Sodium 139 135 - 145 mmol/L   Potassium 3.8 3.5 - 5.1 mmol/L   Chloride 99 (L) 101 - 111 mmol/L   CO2 33 (H) 22 - 32 mmol/L   Glucose, Bld 166 (H) 65 - 99 mg/dL   BUN 13 6 - 20 mg/dL   Creatinine, Ser 0.62 0.44 - 1.00 mg/dL   Calcium 9.6 8.9 - 10.3 mg/dL   Total Protein 7.8 6.5 - 8.1 g/dL   Albumin 3.7 3.5 - 5.0 g/dL   AST 25 15 - 41 U/L   ALT 14 14 -  54 U/L   Alkaline Phosphatase 68 38 - 126 U/L   Total Bilirubin 0.6 0.3 - 1.2 mg/dL   GFR calc non Af Amer >60 >60 mL/min   GFR calc Af Amer >60 >60 mL/min   Anion gap 7 5 - 15  Lipase, blood  Result Value Ref Range   Lipase 19 11 - 51 U/L  CBC with Differential/Platelet  Result Value Ref Range   WBC 9.3 4.0 - 10.5 K/uL   RBC 4.16 3.87 - 5.11 MIL/uL   Hemoglobin 13.1 12.0 - 15.0 g/dL   HCT 40.1 36.0 - 46.0 %   MCV 96.4 78.0 - 100.0 fL   MCH 31.5 26.0 - 34.0 pg   MCHC 32.7 30.0 - 36.0 g/dL   RDW 13.2 11.5 - 15.5 %   Platelets 191 150 - 400 K/uL   Neutrophils Relative % 70 %   Neutro Abs 6.5 1.7 - 7.7 K/uL   Lymphocytes Relative 23 %   Lymphs Abs 2.1 0.7 - 4.0 K/uL   Monocytes Relative 5 %   Monocytes Absolute 0.5 0.1 - 1.0 K/uL   Eosinophils Relative 2 %   Eosinophils Absolute 0.1 0.0 - 0.7 K/uL   Basophils Relative 0 %   Basophils Absolute 0.0 0.0 - 0.1 K/uL  Urinalysis, Routine w reflex microscopic (not at [facility])  Result Value Ref Range   Color, Urine YELLOW YELLOW   APPearance CLOUDY (A) CLEAR   Specific Gravity, Urine 1.011 1.005 - 1.030   pH 7.5 5.0 - 8.0   Glucose, UA NEGATIVE NEGATIVE mg/dL   Hgb urine dipstick NEGATIVE NEGATIVE   Bilirubin Urine NEGATIVE NEGATIVE   Ketones, ur NEGATIVE NEGATIVE mg/dL   Protein, ur  NEGATIVE NEGATIVE mg/dL   Nitrite NEGATIVE NEGATIVE   Leukocytes, UA NEGATIVE NEGATIVE     EKG  EKG Interpretation  Date/Time:  Thursday December 26 2015 10:50:48 EDT Ventricular Rate:  78 PR Interval:    QRS Duration: 90 QT Interval:  498 QTC Calculation: 568 R Axis:   1 Text Interpretation:  Sinus rhythm Short PR interval Abnormal R-wave progression, early transition Borderline T abnormalities, diffuse leads Prolonged QT interval Baseline wander in lead(s) I II aVR aVL V1 V2 V3 V4 V5 V6 similar to 12/11/14 EKG Suspect arm lead reversal of 10/14/15 EKG Confirmed by Rogene Houston  MD, [place] [phone]) on 12/26/2015 10:56:24 AM Also confirmed by Rogene Houston  MD, [place] [phone]), editor Yehuda Mao [phone])  on 12/26/2015 11:13:10 AM       Radiology Dg Chest 2 View  Result Date: 12/26/2015 CLINICAL DATA:  Patient here for LLQ abdominal pains, nausea, vomiting, weakness, all since last night, hx of diabetes, HTN, asthma, Gout, hysterectomy, cholecystectomy, no other complaints EXAM: CHEST  2 VIEW COMPARISON:  12/09/2015 FINDINGS: Cardiac silhouette is borderline enlarged. No mediastinal or hilar masses or evidence of adenopathy. Mildly prominent bronchovascular markings most evident centrally, stable. No lung consolidation or edema. No pleural effusion or pneumothorax. Bony thorax is demineralized but grossly intact. IMPRESSION: No acute cardiopulmonary disease. Electronically Signed   By: Lajean Manes M.D.   On: 12/26/2015 12:23   Ct Abdomen Pelvis W Contrast  Result Date: 12/26/2015 CLINICAL DATA:  Patient here for LLQ abdominal pains, nausea, vomiting (unable to keep down oral CM), weakness, all since last night, hx of diabetes, HTN, asthma, Gout, hysterectomy, cholecystectomy, no other complaints EXAM: CT ABDOMEN AND PELVIS WITH CONTRAST TECHNIQUE: Multidetector CT imaging of the abdomen and pelvis was performed using the standard protocol following bolus  administration of intravenous contrast.  CONTRAST:  126mL ISOVUE-300 IOPAMIDOL (ISOVUE-300) INJECTION 61% COMPARISON:  None. FINDINGS: Lung bases: Mild interstitial thickening and posterior medial lower lobe bronchiectasis. Heart normal in size. Hepatobiliary: Normal liver. There is intra and extrahepatic bile duct dilation. Common bile duct measures a maximum of 15 mm. There is normal distal tapering. Gallbladder is absent. Spleen:  Normal. Pancreas:  Mild diffuse atrophy.  No mass or inflammatory change. Adrenal glands:  No masses. Kidneys, ureters, bladder: Mild bilateral renal cortical thinning. No renal masses or stones. No hydronephrosis ureters normal in course and in caliber. Bladder is unremarkable. Uterus and adnexa: Uterus surgically absent. Right adnexal cyst measuring 4.6 x 3.2 x 4.2 cm. This may reflect a residual right ovary mildly enlarged with a cyst or inclusion cyst. Lymph nodes:  No adenopathy. Ascites:  None. Vascular: Atherosclerotic calcifications along the abdominal aorta and iliac vessels. No aneurysm. Gastrointestinal: Stomach and small bowel are unremarkable. There are numerous left colon diverticula vertically along the sigmoid colon. No evidence of diverticulitis. Colon otherwise unremarkable. Normal appendix visualized. Musculoskeletal: Significant degenerative changes throughout the visualized spine as well as a lumbar dextroscoliosis. No osteoblastic or osteolytic lesions. IMPRESSION: 1. No acute findings in the abdomen pelvis. No findings to explain left lower quadrant abdominal pain. 2. Multiple left colon diverticula without evidence diverticulitis. 3. Right adnexal cyst measuring 4.6 cm in greatest dimension. Recommend nonemergent follow-up with pelvic ultrasound for further characterization. 4. Status post cholecystectomy. Chronic dilation of the intra and extrahepatic biliary tree. 5. Aortic atherosclerosis. 6. Significant degenerative changes noted throughout the visualized spine. Electronically Signed   By: Lajean Manes M.D.   On: 12/26/2015 12:30    Procedures Procedures (including critical care time)  Medications Ordered in ED Medications  0.9 %  sodium chloride infusion ( Intravenous New Bag/Given 12/26/15 1302)  sodium chloride 0.9 % bolus 500 mL (0 mLs Intravenous Stopped 12/26/15 1301)  ondansetron (ZOFRAN) injection 4 mg (4 mg Intravenous Given 12/26/15 1106)  fentaNYL (SUBLIMAZE) injection 12.5 mcg (12.5 mcg Intravenous Given 12/26/15 1104)  iopamidol (ISOVUE-300) 61 % injection 100 mL (100 mLs Intravenous Contrast Given 12/26/15 1203)     Initial Impression / Assessment and Plan / ED Course  I have reviewed the triage vital signs and the nursing notes.  Pertinent labs & imaging results that were available during my care of the patient were reviewed by me and considered in my medical decision making (see chart for details).  Clinical Course    The patient presenting with left lower quadrant abdominal pain has been intermittent for the past week. We gotten worse lately. Not associated with any nausea vomiting or diarrhea. Workup here shows no evidence of any acute abdominal process. Was concern for diverticulitis. They show evidence of diverticulosis I guess it possibly could be causing some mild discomfort. Labs without any significant abnormality to include a normal urine. Patient will be treated with tramadol and follow-up with her primary care doctor. Consideration for possible colonoscopy.   Final Clinical Impressions(s) / ED Diagnoses   Final diagnoses:  Left lower quadrant pain    New Prescriptions New Prescriptions   TRAMADOL (ULTRAM) 50 MG TABLET    Take 1 tablet (50 mg total) by mouth every 6 (six) hours as needed.     Fredia Sorrow, MD 12/26/15 [phone]

## 2017-07-16 ENCOUNTER — Other Ambulatory Visit: Payer: Self-pay | Admitting: Neurosurgery

## 2017-07-16 DIAGNOSIS — M4150 Other secondary scoliosis, site unspecified: Principal | ICD-10-CM

## 2017-07-26 ENCOUNTER — Ambulatory Visit (INDEPENDENT_AMBULATORY_CARE_PROVIDER_SITE_OTHER): Payer: Medicare Other

## 2017-07-26 DIAGNOSIS — M48061 Spinal stenosis, lumbar region without neurogenic claudication: Secondary | ICD-10-CM | POA: Diagnosis not present

## 2017-07-26 DIAGNOSIS — M5147 Schmorl's nodes, lumbosacral region: Secondary | ICD-10-CM

## 2017-07-26 DIAGNOSIS — M4150 Other secondary scoliosis, site unspecified: Principal | ICD-10-CM

## 2021-04-15 ENCOUNTER — Other Ambulatory Visit: Payer: Self-pay

## 2021-04-15 ENCOUNTER — Emergency Department (HOSPITAL_BASED_OUTPATIENT_CLINIC_OR_DEPARTMENT_OTHER): Payer: Medicare Other

## 2021-04-15 ENCOUNTER — Emergency Department (HOSPITAL_BASED_OUTPATIENT_CLINIC_OR_DEPARTMENT_OTHER)
Admission: EM | Admit: 2021-04-15 | Discharge: 2021-04-15 | Disposition: A | Discharge status: Home / Self Care | Payer: Medicare Other | Attending: Emergency Medicine | Admitting: Emergency Medicine

## 2021-04-15 ENCOUNTER — Encounter (HOSPITAL_BASED_OUTPATIENT_CLINIC_OR_DEPARTMENT_OTHER): Payer: Self-pay | Admitting: *Deleted

## 2021-04-15 DIAGNOSIS — I10 Essential (primary) hypertension: Secondary | ICD-10-CM | POA: Diagnosis not present

## 2021-04-15 DIAGNOSIS — Z85828 Personal history of other malignant neoplasm of skin: Secondary | ICD-10-CM | POA: Insufficient documentation

## 2021-04-15 DIAGNOSIS — Z79899 Other long term (current) drug therapy: Secondary | ICD-10-CM | POA: Diagnosis not present

## 2021-04-15 DIAGNOSIS — Z7982 Long term (current) use of aspirin: Secondary | ICD-10-CM | POA: Insufficient documentation

## 2021-04-15 DIAGNOSIS — S3992XA Unspecified injury of lower back, initial encounter: Secondary | ICD-10-CM | POA: Diagnosis not present

## 2021-04-15 DIAGNOSIS — W19XXXA Unspecified fall, initial encounter: Secondary | ICD-10-CM | POA: Diagnosis not present

## 2021-04-15 DIAGNOSIS — E119 Type 2 diabetes mellitus without complications: Secondary | ICD-10-CM | POA: Diagnosis not present

## 2021-04-15 MED ORDER — OXYCODONE-ACETAMINOPHEN 5-325 MG PO TABS
1.0000 | ORAL_TABLET | Freq: Once | ORAL | Status: AC
  Administered 2021-04-15: 1 via ORAL

## 2021-04-15 NOTE — ED Triage Notes (Signed)
She fell onto her tailbone 3 days ago.

## 2021-04-15 NOTE — Discharge Instructions (Addendum)
Please use Tylenol or ibuprofen for pain.  You may use 600 mg ibuprofen every 6 hours or 1000 mg of Tylenol every 6 hours.  You may choose to alternate between the 2.  This would be most effective.  Not to exceed 4 g of Tylenol within 24 hours.  Not to exceed 3200 mg ibuprofen 24 hours.  I recommend you use a donut pillow. Please follow up with orthopedics if you pain continues or fails to improve despite treatment.

## 2021-04-15 NOTE — ED Notes (Signed)
Patient transported to CT 

## 2021-04-15 NOTE — ED Provider Notes (Signed)
[facility] EMERGENCY DEPARTMENT Provider Note   CSN: [id number] Arrival date & time: 04/15/21  1544     History Chief Complaint  Patient presents with   Lytle Michaels    Jo Hayes is a 87 y.o. female with a past medical history significant for frequent falls, arthritis who presents with concern for injury to her tailbone after a fall 10 days ago.  Patient reports that she had a low-energy fall from a bending position directly on her tailbone.  Patient denies any injury to head, loss of consciousness.  Patient reports that she was able to scoot herself to the door to call for help, and has been able to walk with some pain since then.  Patient reports some significant pain with sitting down.  Patient has taken 1 Aleve with minimal relief.  Patient reports her pain is 5/10 at this time.  Patient reports that she has been trying to use a donut pillow, however the one that she has is somewhat uncomfortable, is not providing significant relief of her pain.  Patient reports the pain is sharp in nature.   Fall      Past Medical History:  Diagnosis Date   Anxiety    Arthritis    Asthma    Blood transfusion without reported diagnosis    Diabetes mellitus without complication ([place])    Fall at home    hx of falls   Gout    Hypertension    Skin cancer of face     Patient Active Problem List   Diagnosis Date Noted   CAP (community acquired pneumonia) 10/14/2015   Essential hypertension 10/14/2015   Asthma exacerbation 10/14/2015   Type 2 diabetes mellitus ([place]) 10/14/2015   Chronic back pain 10/14/2015   Gout 10/14/2015   Hypoalbuminemia 10/14/2015   Hyponatremia 10/14/2015   Anemia 10/14/2015   HNP (herniated nucleus pulposus), cervical 12/14/2014    Past Surgical History:  Procedure Laterality Date   ABDOMINAL HYSTERECTOMY     ANKLE SURGERY     ANTERIOR CERVICAL DECOMP/DISCECTOMY FUSION N/A 12/14/2014   Procedure: ANTERIOR CERVICAL DECOMPRESSION/DISCECTOMY FUSION   CERVICALFOUR-FIVE WITH PLATING BONEGRAFT;  Surgeon: Ashok Pall, MD;  Location: [facility];  Service: Neurosurgery;  Laterality: N/A;   BACK SURGERY     CHOLECYSTECTOMY     SKIN BIOPSY     TONSILLECTOMY       OB History   No obstetric history on file.     No family history on file.  Social History   Tobacco Use   Smoking status: Never   Smokeless tobacco: Never  Substance Use Topics   Alcohol use: No   Drug use: No    Home Medications Prior to Admission medications   Medication Sig Start Date End Date Taking? Authorizing Provider  albuterol (PROAIR HFA) 108 (90 Base) MCG/ACT inhaler Inhale 2 puffs into the lungs every 4 (four) hours as needed for wheezing or shortness of breath.  07/05/15   [provider]  allopurinol (ZYLOPRIM) 100 MG tablet Take 100 mg by mouth daily.    [provider]  amLODipine (NORVASC) 5 MG tablet Take 5 mg by mouth daily.    [provider]  aspirin (GOODSENSE ASPIRIN) 325 MG tablet Take 325 mg by mouth daily.    [provider]  atenolol (TENORMIN) 50 MG tablet Take 50 mg by mouth 2 (two) times daily.    [provider]  clonazePAM (KLONOPIN) 1 MG tablet Take 1 mg by mouth every  12 (twelve) hours as needed for anxiety.  09/25/15   [provider]  diclofenac sodium (VOLTAREN) 1 % GEL Apply 4 g topically 2 (two) times daily as needed (for knee pain).  10/22/14   [provider]  fexofenadine (ALLEGRA) 180 MG tablet Take 180 mg by mouth daily.    [provider]  glipiZIDE (GLUCOTROL) 5 MG tablet Take 0.5 tablets (2.5 mg total) by mouth 2 (two) times daily before a meal. 10/17/15   Reyne Dumas, MD  montelukast (SINGULAIR) 10 MG tablet Take 10 mg by mouth at bedtime.    [provider]  traMADol (ULTRAM) 50 MG tablet Take 1 tablet (50 mg total) by mouth every 6 (six) hours as needed. 12/26/15   Fredia Sorrow, MD  valsartan (DIOVAN) 40 MG tablet Take 40 mg by mouth 2 (two)  times daily. 09/27/15   [provider]    Allergies    Avelox [moxifloxacin hcl in nacl], Hydrochlorothiazide, Sulfa antibiotics, Clemastine, Colchicine, Demerol [meperidine], Duloxetine, Hydrochlorothiazide w-triamterene, and Sulfamethoxazole  Review of Systems   Review of Systems  Musculoskeletal:  Positive for back pain and gait problem.  All other systems reviewed and are negative.  Physical Exam Updated Vital Signs BP 140/64 (BP Location: Right Arm)   Pulse 76   Temp 97.7 F (36.5 C) (Oral)   Resp 17   Ht 4\' 11"  (1.499 m)   Wt 58.5 kg   SpO2 99%   BMI 26.05 kg/m   Physical Exam Vitals and nursing note reviewed.  Constitutional:      General: She is not in acute distress.    Appearance: Normal appearance.  HENT:     Head: Normocephalic and atraumatic.  Eyes:     General:        Right eye: No discharge.        Left eye: No discharge.  Cardiovascular:     Rate and Rhythm: Normal rate and regular rhythm.     Pulses: Normal pulses.     Heart sounds: No murmur heard.   No friction rub. No gallop.  Pulmonary:     Effort: Pulmonary effort is normal.     Breath sounds: Normal breath sounds.  Abdominal:     General: Bowel sounds are normal.     Palpations: Abdomen is soft.  Musculoskeletal:     Comments: Intact strength 5 out of 5 bilateral lower extremities.  Patient does have tenderness to palpation over her sacrum and coccyx.  Without step-off or deformity.  Patient is able to ambulate normal compared to her baseline.  Skin:    General: Skin is warm and dry.     Capillary Refill: Capillary refill takes less than 2 seconds.  Neurological:     Mental Status: She is alert and oriented to person, place, and time.  Psychiatric:        Mood and Affect: Mood normal.        Behavior: Behavior normal.    ED Results / Procedures / Treatments   Labs (all labs ordered are listed, but only abnormal results are displayed) Labs Reviewed - No data to  display  EKG None  Radiology DG Sacrum/Coccyx  Result Date: 04/15/2021 CLINICAL DATA:  Fall to tailbone 3 days ago. EXAM: SACRUM AND COCCYX - 2+ VIEW COMPARISON:  CT of the abdomen and pelvis from 2017. FINDINGS: Sacroiliac joints are symmetric. No interruption of arcuate lines. Some cortical disruption along the anterior margin the lower sacrum is suggested with perhaps slightly  more angulation at this level. Incidental note is made of marked degenerative changes in the lumbar spine which appears similar to prior CT imaging. IMPRESSION: Suggestion of nondisplaced fracture of the inferior sacrum likely the fourth or fifth sacral segment. Marked degenerative changes in the lumbar spine. Electronically Signed   By: Zetta Bills M.D.   On: 04/15/2021 16:37   CT PELVIS WO CONTRAST  Result Date: 04/15/2021 CLINICAL DATA:  Possible sacral fracture EXAM: CT PELVIS WITHOUT CONTRAST TECHNIQUE: Multidetector CT imaging of the pelvis was performed following the standard protocol without intravenous contrast. COMPARISON:  CT abdomen pelvis 12/26/2015 Sacral radiograph 04/15/2021 FINDINGS: Urinary Tract:  No abnormality visualized. Bowel:  Unremarkable visualized pelvic bowel loops. Vascular/Lymphatic: Calcific aortic atherosclerosis. Reproductive: Cystic structure in the right adnexa measures 6.1 x 4.2 cm, previously 4.1 x 3.3 cm. Other:  None Musculoskeletal: No sacral fracture or other acute osseous abnormality IMPRESSION: 1. No sacral fracture or other acute osseous abnormality. 2. 6.1 cm right adnexal simple-appearing cyst, not adequately characterized. Recommend prompt follow-up with pelvic US, which may be obtained as an outpatient. Reference: JACR 2020 Feb;17(2):248-254 Aortic Atherosclerosis (ICD10-I70.0). Electronically Signed   By: Ulyses Jarred M.D.   On: 04/15/2021 20:39    Procedures Procedures   Medications Ordered in ED Medications  oxyCODONE-acetaminophen (PERCOCET/ROXICET) 5-325 MG per  tablet 1 tablet (1 tablet Oral Given 04/15/21 1955)    ED Course  I have reviewed the triage vital signs and the nursing notes.  Pertinent labs & imaging results that were available during my care of the patient were reviewed by me and considered in my medical decision making (see chart for details).  Clinical Course as of 04/16/21 0028  Tue Apr 15, 2021  2014 Chrys Racer with ortho recommends CT -- if no new findings, recommend donut pillow, pain control, outpatient follow up, weight bearing as tolerated [CP]    Clinical Course User Index [CP] Carlus Pavlov, Jackalyn Lombard   MDM Rules/Calculators/A&P                          I discussed this case with my attending physician who cosigned this note including patient's presenting symptoms, physical exam, and planned diagnostics and interventions. Attending physician stated agreement with plan or made changes to plan which were implemented.   Neurovascularly intact patient with concern for sacrum fracture.  Radiographic imaging reveals concern for possible nondisplaced fracture of the fourth or fifth sacral segment of the inferior sacrum.  Patient is not having any neurologic symptoms, any difficulty with urination, defecation, numbness, tingling.  Spoke with Chrys Racer with orthopedics who recommends additional CT imaging to further elucidate, and characterize her sacral fracture.  CT imaging reveals that patient does not in fact have a fracture of her sacrum.  Given patient's significant pain do believe that she has a deep tissue bruise.  No evidence of hematoma, or other abnormality.  Discussed recommendations for donut pillow, pain control, weightbearing as tolerated.  Encourage follow-up with orthopedics if pain persists without improvement for another 1 to 2 weeks.  Patient understands and agrees to plan, discharged in stable condition at this time.  Final Clinical Impression(s) / ED Diagnoses Final diagnoses:  Fall, initial encounter  Injury  of coccyx, initial encounter    Rx / DC Orders ED Discharge Orders     None        Anselmo Pickler, PA-C 04/16/21 0030    Gareth Morgan, MD 04/16/21 1227

## 2021-04-28 ENCOUNTER — Ambulatory Visit: Payer: Medicare Other | Admitting: Family Medicine

## 2021-05-15 ENCOUNTER — Encounter (HOSPITAL_COMMUNITY): Payer: Self-pay | Admitting: Radiology

## 2021-05-26 ENCOUNTER — Telehealth: Payer: Medicare Other | Admitting: Emergency Medicine

## 2021-05-26 DIAGNOSIS — N83201 Unspecified ovarian cyst, right side: Secondary | ICD-10-CM

## 2021-05-26 NOTE — Patient Instructions (Signed)
°  Durward Fortes, thank you for joining Carvel Getting, NP for today's virtual visit.  While this provider is not your primary care provider (PCP), if your PCP is located in our provider database this encounter information will be shared with them immediately following your visit.  Consent: (Patient) Lyric Rossano provided verbal consent for this virtual visit at the beginning of the encounter.  Current Medications:  Current Outpatient Medications:    albuterol (PROAIR HFA) 108 (90 Base) MCG/ACT inhaler, Inhale 2 puffs into the lungs every 4 (four) hours as needed for wheezing or shortness of breath. , Disp: , Rfl:    allopurinol (ZYLOPRIM) 100 MG tablet, Take 100 mg by mouth daily., Disp: , Rfl:    amLODipine (NORVASC) 5 MG tablet, Take 5 mg by mouth daily., Disp: , Rfl:    aspirin (GOODSENSE ASPIRIN) 325 MG tablet, Take 325 mg by mouth daily., Disp: , Rfl:    atenolol (TENORMIN) 50 MG tablet, Take 50 mg by mouth 2 (two) times daily., Disp: , Rfl:    clonazePAM (KLONOPIN) 1 MG tablet, Take 1 mg by mouth every 12 (twelve) hours as needed for anxiety. , Disp: , Rfl:    diclofenac sodium (VOLTAREN) 1 % GEL, Apply 4 g topically 2 (two) times daily as needed (for knee pain). , Disp: , Rfl:    fexofenadine (ALLEGRA) 180 MG tablet, Take 180 mg by mouth daily., Disp: , Rfl:    glipiZIDE (GLUCOTROL) 5 MG tablet, Take 0.5 tablets (2.5 mg total) by mouth 2 (two) times daily before a meal., Disp: 60 tablet, Rfl: 1   montelukast (SINGULAIR) 10 MG tablet, Take 10 mg by mouth at bedtime., Disp: , Rfl:    traMADol (ULTRAM) 50 MG tablet, Take 1 tablet (50 mg total) by mouth every 6 (six) hours as needed., Disp: 15 tablet, Rfl: 0   valsartan (DIOVAN) 40 MG tablet, Take 40 mg by mouth 2 (two) times daily., Disp: , Rfl:    Medications ordered in this encounter:  No orders of the defined types were placed in this encounter.    *If you need refills on other medications prior to your next appointment, please contact  your pharmacy*  Follow-Up: Call back or seek an in-person evaluation if the symptoms worsen or if the condition fails to improve as anticipated.  Other Instructions I have forwarded your CT scan results to Dr. Redmond Pulling and I encourage you to speak with her when you see her next about whether or not you need a pelvic ultrasound to take a look at that cystic lesion near your right ovary.   If you have been instructed to have an in-person evaluation today at a local Urgent Care facility, please use the link below. It will take you to a list of all of our available [facility], including address, phone number and hours of operation. Please do not delay care.  [facility]  If you or a family member do not have a primary care provider, use the link below to schedule a visit and establish care. When you choose a [facility] primary care physician or advanced practice provider, you gain a long-term partner in health. Find a Primary Care Provider  Learn more about 's in-office and virtual care options: [facility]

## 2021-05-26 NOTE — Progress Notes (Addendum)
Virtual Visit Consent   Jo Hayes, you are scheduled for a virtual visit with a [facility] provider today.     Just as with appointments in the office, your consent must be obtained to participate.  Your consent will be active for this visit and any virtual visit you may have with one of our providers in the next 365 days.     If you have a MyChart account, a copy of this consent can be sent to you electronically.  All virtual visits are billed to your insurance company just like a traditional visit in the office.    As this is a virtual visit, video technology does not allow for your provider to perform a traditional examination.  This may limit your provider's ability to fully assess your condition.  If your provider identifies any concerns that need to be evaluated in person or the need to arrange testing (such as labs, EKG, etc.), we will make arrangements to do so.     Although advances in technology are sophisticated, we cannot ensure that it will always work on either your end or our end.  If the connection with a video visit is poor, the visit may have to be switched to a telephone visit.  With either a video or telephone visit, we are not always able to ensure that we have a secure connection.     I need to obtain your verbal consent now.   Are you willing to proceed with your visit today?    [name] has provided verbal consent on 05/26/2021 for a virtual visit (video or telephone).   Carvel Getting, NP   Date: 05/26/2021 1:52 PM   Virtual Visit via Video Note   I, Carvel Getting, connected with  Jo Hayes  ([id number], 1927-10-04) on 05/26/21 at  1:30 PM EST by a telemedicine application and verified that I am speaking with the correct person using two identifiers.  Patient does not have access to a video enabled device and we connected by telephone only. Interactive audio and video communications were attempted, although failed due to patient's inability to connect to video.  Continued visit with audio only interaction with patient agreement.  Location: Patient: Virtual Visit Location Patient: Home Provider: Virtual Visit Location Provider: Home Office   I discussed the limitations of evaluation and management by telemedicine and the availability of in person appointments. The patient expressed understanding and agreed to proceed.    History of Present Illness: Jo Hayes is a 88 y.o. who identifies as a female who was assigned female at birth, and is being seen today for follow-up of abnormal imaging results.  Patient was seen in the emergency room 04/15/2021 for a fall and possible sacral fracture.  She had a CT scan of her pelvis without contrast that showed an incidental 6.1 cm right adnexal simple appearing cyst but it was not adequately characterized and the radiologist recommended prompt follow-up with a pelvic ultrasound.  Patient was unaware of this result.  However, she has been told in the past that she has a cyst on her ovary and she reports that it has been checked out previously.  She is not certain but she believes she had an abdominal ultrasound at some point in time; she denies ever having a transvaginal ultrasound.  Review of prior records show that she had a CT scan of her abdomen and pelvis with contrast in 2017 that showed a 4.6 cm cyst in the right adnexa in  addition to the CT scan in November 2022.  Looking at care everywhere, patient had a CT [facility] in January 2021 [facility] that showed a 5.2 x 4.1 cm cystic lesion in the right adnexa/ovary.   HPI: HPI  Problems:  Patient Active Problem List   Diagnosis Date Noted   CAP (community acquired pneumonia) 10/14/2015   Essential hypertension 10/14/2015   Asthma exacerbation 10/14/2015   Type 2 diabetes mellitus ([place]) 10/14/2015   Chronic back pain 10/14/2015   Gout 10/14/2015   Hypoalbuminemia 10/14/2015   Hyponatremia 10/14/2015   Anemia 10/14/2015   HNP (herniated nucleus  pulposus), cervical 12/14/2014    Allergies:  Allergies  Allergen Reactions   Avelox [Moxifloxacin Hcl In Nacl] Other (See Comments)    Other reaction(s): Hallucinations Jittery "nervous wreck"   Hydrochlorothiazide Other (See Comments)    Other reaction(s): Other Urinary frequency "pee too much"   Sulfa Antibiotics Other (See Comments) and Hives    Face flushed red   Clemastine     Other reaction(s): Other (See Comments) unknown   Colchicine Diarrhea   Demerol [Meperidine] Other (See Comments)    hallucination   Duloxetine     Other reaction(s): Dizziness (intolerance)   Hydrochlorothiazide W-Triamterene     Other reaction(s): Dizziness (intolerance)   Sulfamethoxazole     Other reaction(s): Other (See Comments) Facial swelling   Medications:  Current Outpatient Medications:    albuterol (PROAIR HFA) 108 (90 Base) MCG/ACT inhaler, Inhale 2 puffs into the lungs every 4 (four) hours as needed for wheezing or shortness of breath. , Disp: , Rfl:    allopurinol (ZYLOPRIM) 100 MG tablet, Take 100 mg by mouth daily., Disp: , Rfl:    amLODipine (NORVASC) 5 MG tablet, Take 5 mg by mouth daily., Disp: , Rfl:    aspirin (GOODSENSE ASPIRIN) 325 MG tablet, Take 325 mg by mouth daily., Disp: , Rfl:    atenolol (TENORMIN) 50 MG tablet, Take 50 mg by mouth 2 (two) times daily., Disp: , Rfl:    clonazePAM (KLONOPIN) 1 MG tablet, Take 1 mg by mouth every 12 (twelve) hours as needed for anxiety. , Disp: , Rfl:    diclofenac sodium (VOLTAREN) 1 % GEL, Apply 4 g topically 2 (two) times daily as needed (for knee pain). , Disp: , Rfl:    fexofenadine (ALLEGRA) 180 MG tablet, Take 180 mg by mouth daily., Disp: , Rfl:    glipiZIDE (GLUCOTROL) 5 MG tablet, Take 0.5 tablets (2.5 mg total) by mouth 2 (two) times daily before a meal., Disp: 60 tablet, Rfl: 1   montelukast (SINGULAIR) 10 MG tablet, Take 10 mg by mouth at bedtime., Disp: , Rfl:    traMADol (ULTRAM) 50 MG tablet, Take 1 tablet (50 mg total)  by mouth every 6 (six) hours as needed., Disp: 15 tablet, Rfl: 0   valsartan (DIOVAN) 40 MG tablet, Take 40 mg by mouth 2 (two) times daily., Disp: , Rfl:   Observations/Objective: Patient is  in no acute distress.  No labored breathing.  Speech is clear and coherent with logical content.  Patient is alert and oriented at baseline.    Assessment and Plan: 1. Cyst of right ovary  Discussed options with patient at length.  She now has a PCP as of December 2022, Dr. Redmond Pulling.  Patient believes she had this cyst evaluated by abdominal ultrasound at some point in time but cannot be sure.  I offered to order a follow-up pelvic ultrasound for her or to  share CT results with her new PCP Dr. Redmond Pulling so that she can discuss options with Dr. Redmond Pulling the next time she sees her (in about 6 weeks).  Patient elects to have me share information with Dr. Redmond Pulling and she will discuss with Dr. Redmond Pulling whether or not she needs any follow-up testing.  Follow Up Instructions: I discussed the assessment and treatment plan with the patient. The patient was provided an opportunity to ask questions and all were answered. The patient agreed with the plan and demonstrated an understanding of the instructions.  A copy of instructions were sent to the patient via MyChart unless otherwise noted below.   The patient was advised to call back or seek an in-person evaluation if the symptoms worsen or if the condition fails to improve as anticipated.  Time:  I spent 15 minutes with the patient via telehealth technology discussing the above problems/concerns.    Carvel Getting, NP

## 2022-09-01 ENCOUNTER — Encounter: Payer: Self-pay | Admitting: *Deleted

## 2023-06-21 IMAGING — CT CT PELVIS W/O CM
2 of 3 series · 15 of 46 positions shown, 17 images · non-contrast
Comparison: CT abdomen pelvis 12/26/2015

Sacral radiograph 04/15/2021

CLINICAL DATA: Possible sacral fracture

EXAM:
CT PELVIS WITHOUT CONTRAST
TECHNIQUE: Multidetector CT imaging of the pelvis was performed following the
standard protocol without intravenous contrast.

[Series 5: coronal st · coronal · 0.47mm/px · 3 of 151 slices shown]
[im 51/151  soft-tissue]
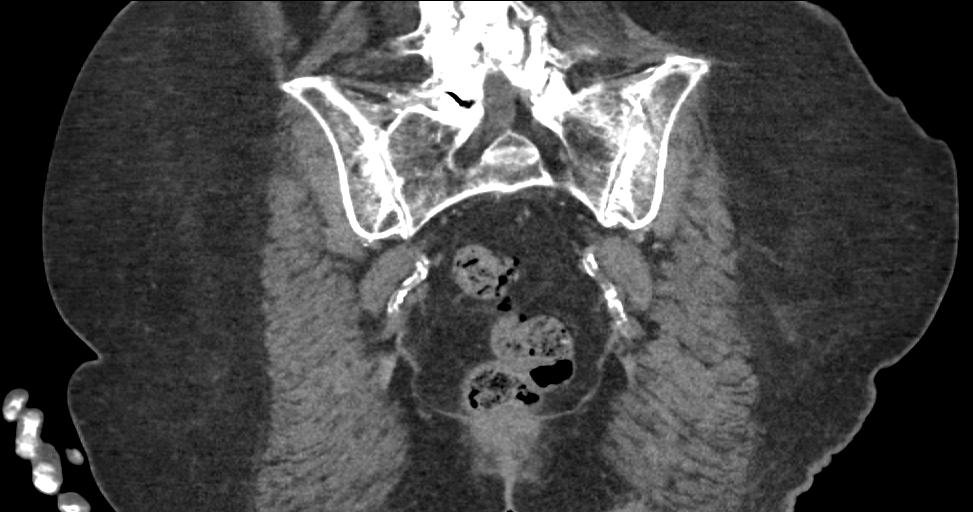
[im 67/151  soft-tissue]
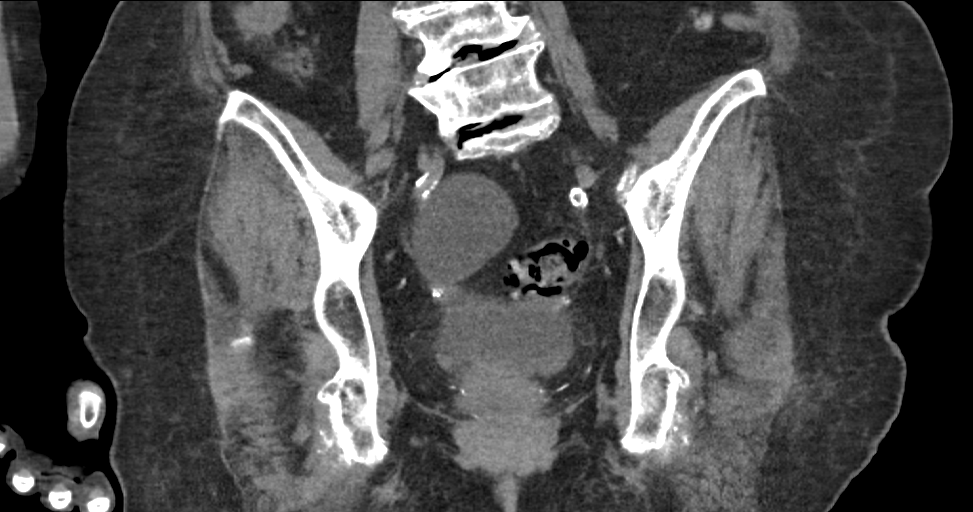
[im 84/151  soft-tissue]
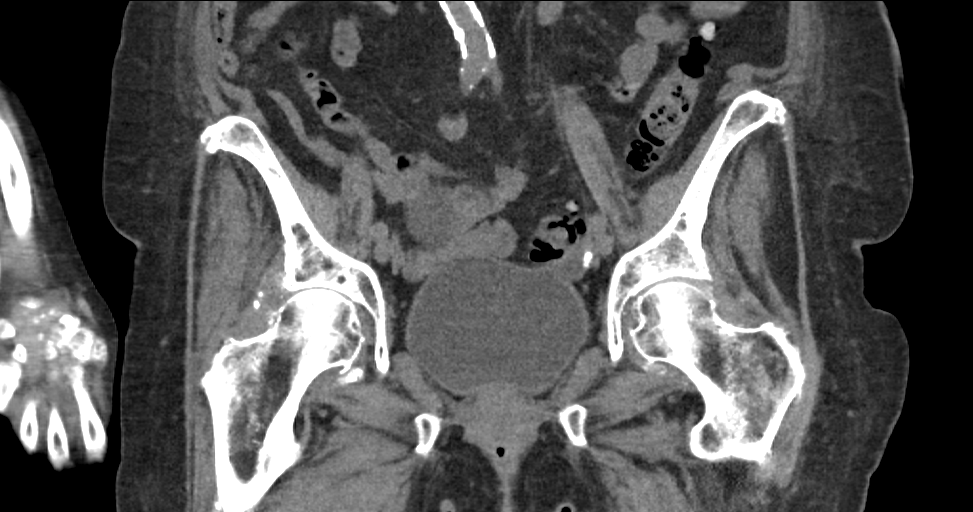

[Series 9: axial soft tissue · axial · 0.69mm/px · z∈[-759,-551]mm · 12 of 120 slices shown, 14 images]
[im 8/120  soft-tissue]
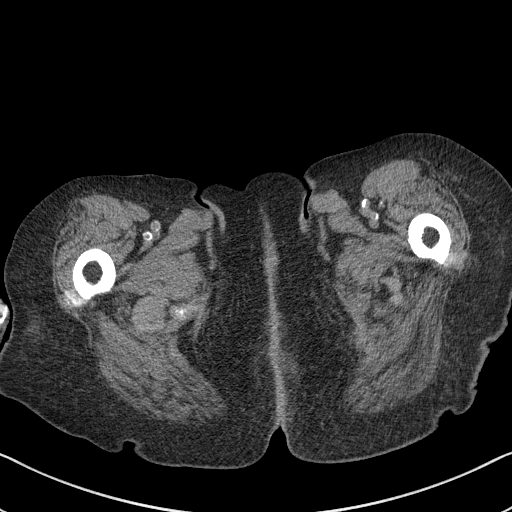
[im 8/120  bone]
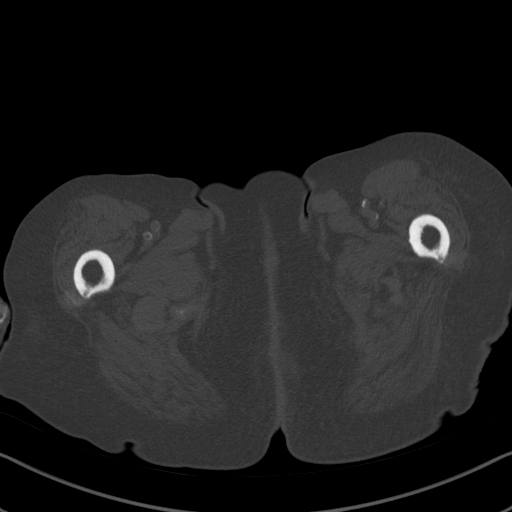
[im 16/120  soft-tissue]
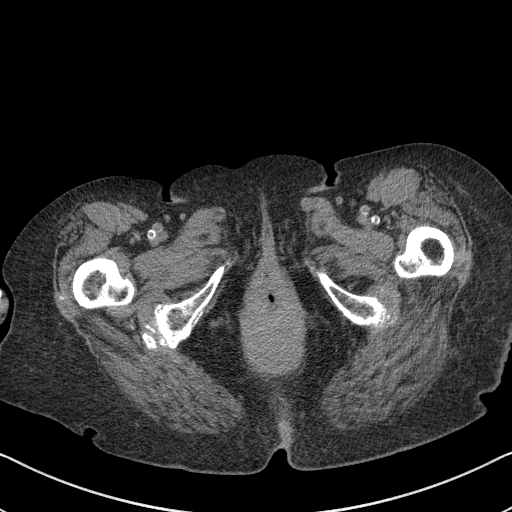
[im 27/120  soft-tissue]
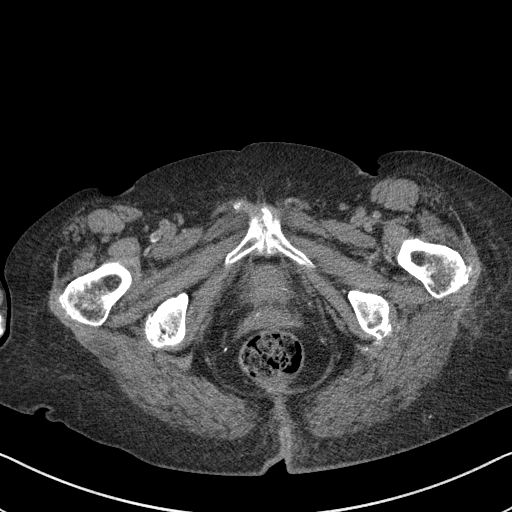
[im 35/120  soft-tissue]
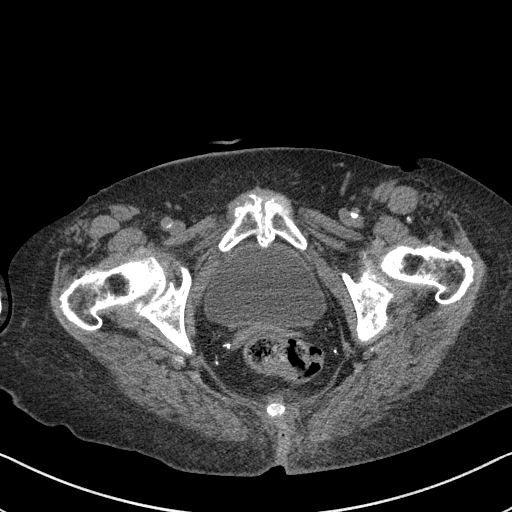
[im 47/120  soft-tissue]
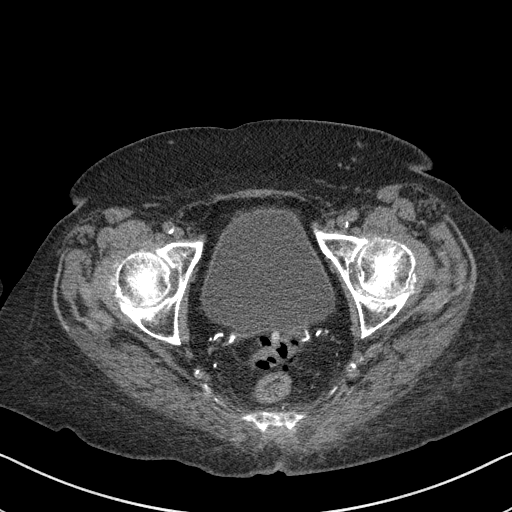
[im 54/120  soft-tissue]
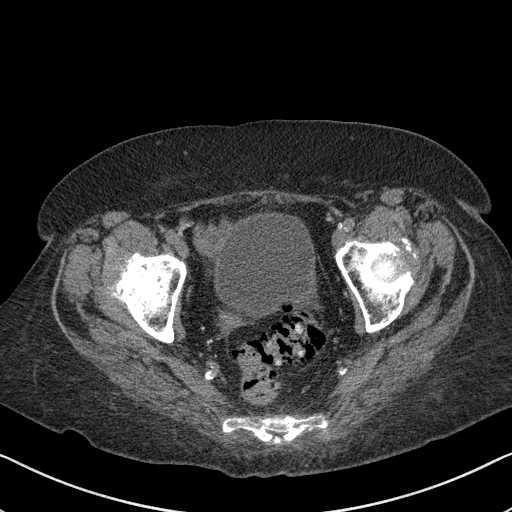
[im 66/120  soft-tissue]
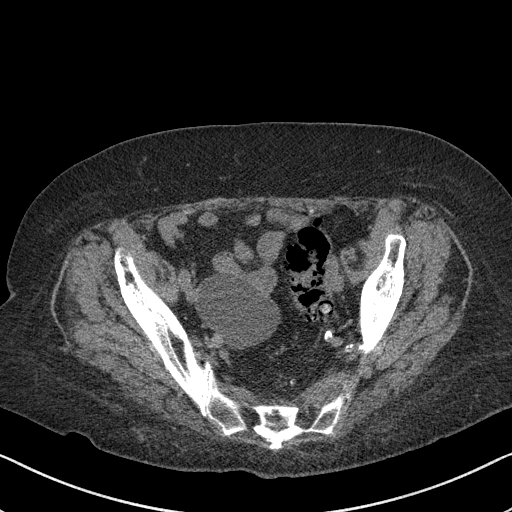
[im 73/120  soft-tissue]
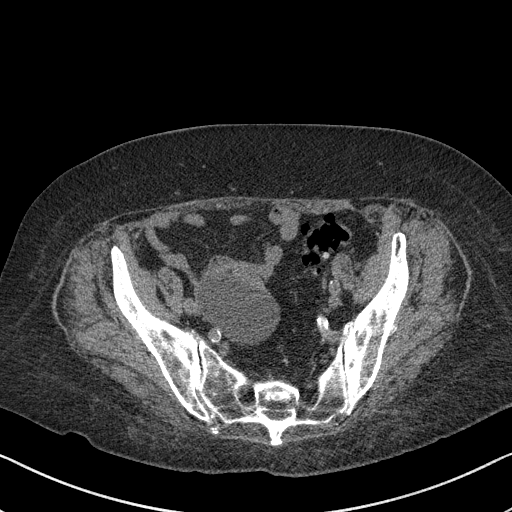
[im 85/120  soft-tissue]
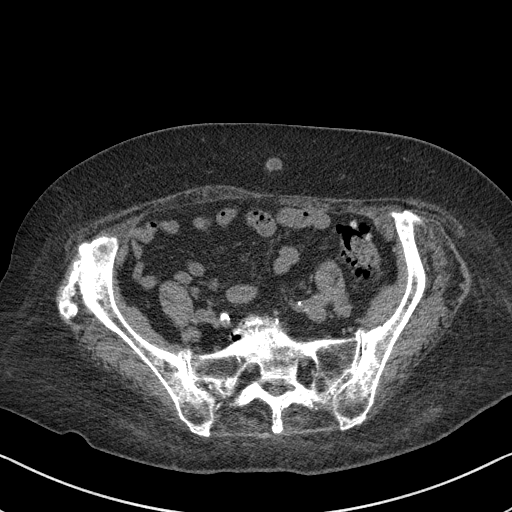
[im 85/120  bone]
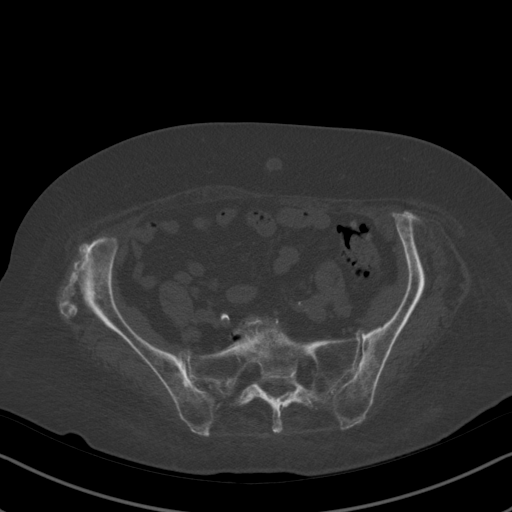
[im 93/120  soft-tissue]
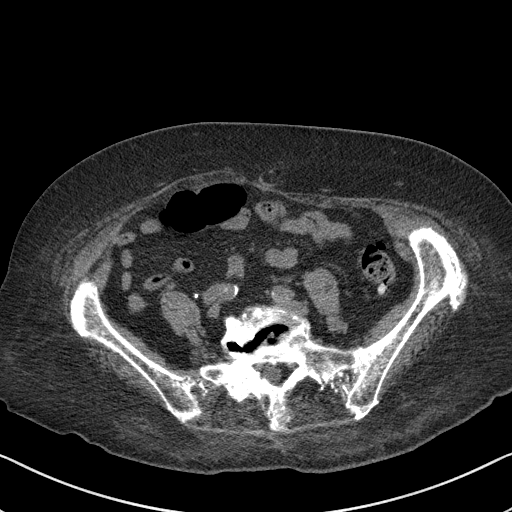
[im 104/120  soft-tissue]
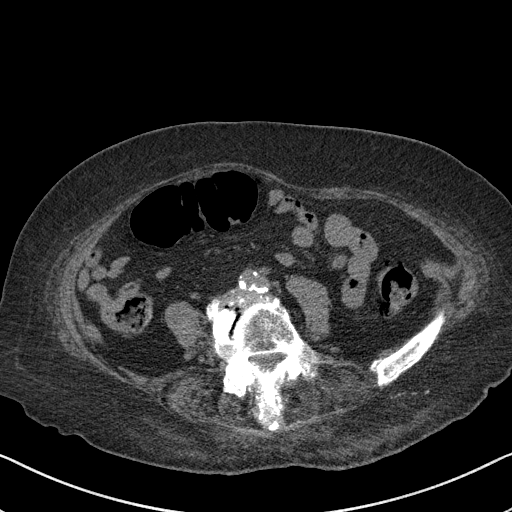
[im 112/120  soft-tissue]
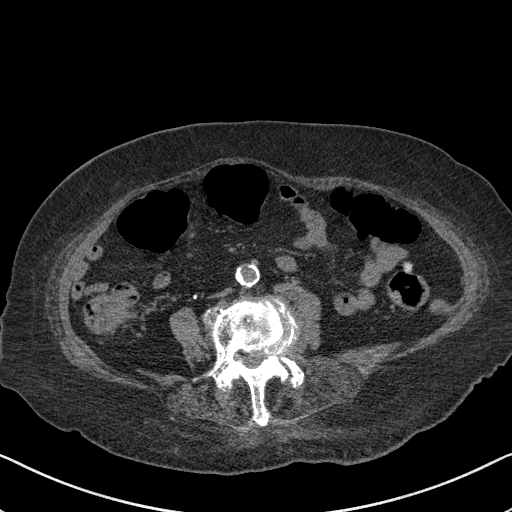

[15 of 46 positions shown; findings below may reference images not displayed]

FINDINGS: Urinary Tract:  No abnormality visualized.

Bowel:  Unremarkable visualized pelvic bowel loops.

Vascular/Lymphatic: Calcific aortic atherosclerosis.

Reproductive: Cystic structure in the right adnexa measures 6.1 x
4.2 cm, previously 4.1 x 3.3 cm.

Other:  None

Musculoskeletal: No sacral fracture or other acute osseous
abnormality
IMPRESSION: 1. No sacral fracture or other acute osseous abnormality.
2. 6.1 cm right adnexal simple-appearing cyst, not adequately
characterized. Recommend prompt follow-up with pelvic US, which may
be obtained as an outpatient.
Reference: JACR [DATE]):[id number]

Aortic Atherosclerosis ([id number].Q).

## 2023-07-14 ENCOUNTER — Other Ambulatory Visit: Payer: Self-pay

## 2023-07-14 ENCOUNTER — Emergency Department (HOSPITAL_BASED_OUTPATIENT_CLINIC_OR_DEPARTMENT_OTHER)
Admission: EM | Admit: 2023-07-14 | Discharge: 2023-07-14 | Disposition: A | Discharge status: Home / Self Care | Payer: 59 | Attending: Emergency Medicine | Admitting: Emergency Medicine

## 2023-07-14 ENCOUNTER — Emergency Department (HOSPITAL_BASED_OUTPATIENT_CLINIC_OR_DEPARTMENT_OTHER): Payer: 59

## 2023-07-14 ENCOUNTER — Encounter (HOSPITAL_BASED_OUTPATIENT_CLINIC_OR_DEPARTMENT_OTHER): Payer: Self-pay | Admitting: Emergency Medicine

## 2023-07-14 DIAGNOSIS — Z79899 Other long term (current) drug therapy: Secondary | ICD-10-CM | POA: Insufficient documentation

## 2023-07-14 DIAGNOSIS — I10 Essential (primary) hypertension: Secondary | ICD-10-CM | POA: Diagnosis not present

## 2023-07-14 DIAGNOSIS — Z7984 Long term (current) use of oral hypoglycemic drugs: Secondary | ICD-10-CM | POA: Diagnosis not present

## 2023-07-14 DIAGNOSIS — J45909 Unspecified asthma, uncomplicated: Secondary | ICD-10-CM | POA: Insufficient documentation

## 2023-07-14 DIAGNOSIS — E119 Type 2 diabetes mellitus without complications: Secondary | ICD-10-CM | POA: Diagnosis not present

## 2023-07-14 DIAGNOSIS — Z85828 Personal history of other malignant neoplasm of skin: Secondary | ICD-10-CM | POA: Insufficient documentation

## 2023-07-14 DIAGNOSIS — G8929 Other chronic pain: Secondary | ICD-10-CM | POA: Insufficient documentation

## 2023-07-14 DIAGNOSIS — M25511 Pain in right shoulder: Secondary | ICD-10-CM | POA: Diagnosis present

## 2023-07-14 DIAGNOSIS — Z7982 Long term (current) use of aspirin: Secondary | ICD-10-CM | POA: Insufficient documentation

## 2023-07-14 LAB — BASIC METABOLIC PANEL
Anion gap: 9 (ref 5–15)
BUN: 12 mg/dL (ref 8–23)
CO2: 30 mmol/L (ref 22–32)
Calcium: 9.1 mg/dL (ref 8.9–10.3)
Chloride: 98 mmol/L (ref 98–111)
Creatinine, Ser: 0.7 mg/dL (ref 0.44–1.00)
GFR, Estimated: >60 mL/min (ref >60)
Glucose, Bld: 100 mg/dL — ABNORMAL HIGH (ref 70–99)
Potassium: 3.8 mmol/L (ref 3.5–5.1)
Sodium: 137 mmol/L (ref 135–145)

## 2023-07-14 LAB — CBC
HCT: 40.2 % (ref 36.0–46.0)
Hemoglobin: 13.5 g/dL (ref 12.0–15.0)
MCH: 32.3 pg (ref 26.0–34.0)
MCHC: 33.6 g/dL (ref 30.0–36.0)
MCV: 96.2 fL (ref 80.0–100.0)
Platelets: 171 10*3/uL (ref 150–400)
RBC: 4.18 MIL/uL (ref 3.87–5.11)
RDW: 13.3 % (ref 11.5–15.5)
WBC: 6.3 10*3/uL (ref 4.0–10.5)
nRBC: 0 % (ref 0.0–0.2)

## 2023-07-14 LAB — TROPONIN I (HIGH SENSITIVITY): Troponin I (High Sensitivity): 5 ng/L (ref <18)

## 2023-07-14 NOTE — ED Triage Notes (Signed)
 Pt c/o RT shoulder pain x 2 wks; no injury

## 2023-07-14 NOTE — Discharge Instructions (Addendum)
 You were evaluated in the emergency room for right shoulder pain.  Your lab work and x-ray did not show any significant abnormality.  Although there are some arthritic changes present on your shoulder.  I suspect that your pain is likely due to arthritis or a tendinitis/bursitis.  You have been provided contact information for orthopedics.  Please call make an appointment at your earliest convenience.  You may use Tylenol 650 mg every 4-6 hours up to 3 times a day for pain.  If your pain persist you may additionally reach out to your primary care doctor for pain management.

## 2023-07-14 NOTE — ED Provider Notes (Signed)
 [facility] Provider Note   CSN: [id number] Arrival date & time: 07/14/23  1452     History  Chief Complaint  Patient presents with   Shoulder Pain    Jo Hayes is a 89 y.o. female who presents with right shoulder pain.  Patient states a few weeks ago she was pushing a book upon a bookshelf and felt pain in her right shoulder.  Pain has persisted since particularly with lifting.  Denies any numbness or tingling in the upper extremity.  No prior surgical procedures to the shoulder.  Denies any fevers or chills.   Shoulder Pain  Past Medical History:  Diagnosis Date   Anxiety    Arthritis    Asthma    Blood transfusion without reported diagnosis    Diabetes mellitus without complication (HCC)    Fall at home    hx of falls   Gout    Hypertension    Skin cancer of face        Home Medications Prior to Admission medications   Medication Sig Start Date End Date Taking? Authorizing Provider  albuterol (PROAIR HFA) 108 (90 Base) MCG/ACT inhaler Inhale 2 puffs into the lungs every 4 (four) hours as needed for wheezing or shortness of breath.  07/05/15   [provider]  allopurinol (ZYLOPRIM) 100 MG tablet Take 100 mg by mouth daily.    [provider]  amLODipine (NORVASC) 5 MG tablet Take 5 mg by mouth daily.    [provider]  aspirin (GOODSENSE ASPIRIN) 325 MG tablet Take 325 mg by mouth daily.    [provider]  atenolol (TENORMIN) 50 MG tablet Take 50 mg by mouth 2 (two) times daily.    [provider]  clonazePAM (KLONOPIN) 1 MG tablet Take 1 mg by mouth every 12 (twelve) hours as needed for anxiety.  09/25/15   [provider]  diclofenac sodium (VOLTAREN) 1 % GEL Apply 4 g topically 2 (two) times daily as needed (for knee pain).  10/22/14   [provider]  fexofenadine (ALLEGRA) 180 MG tablet Take 180 mg by mouth daily.    [provider]  glipiZIDE  (GLUCOTROL) 5 MG tablet Take 0.5 tablets (2.5 mg total) by mouth 2 (two) times daily before a meal. 10/17/15   Richarda Overlie, MD  montelukast (SINGULAIR) 10 MG tablet Take 10 mg by mouth at bedtime.    [provider]  traMADol (ULTRAM) 50 MG tablet Take 1 tablet (50 mg total) by mouth every 6 (six) hours as needed. 12/26/15   Vanetta Mulders, MD  valsartan (DIOVAN) 40 MG tablet Take 40 mg by mouth 2 (two) times daily. 09/27/15   [provider]      Allergies    Avelox [moxifloxacin hcl in nacl], Hydrochlorothiazide, Sulfa antibiotics, Clemastine, Colchicine, Demerol [meperidine], Duloxetine, Hydrochlorothiazide w-triamterene, and Sulfamethoxazole    Review of Systems   Review of Systems  Musculoskeletal:  Positive for arthralgias and myalgias.    Physical Exam Updated Vital Signs BP (!) 141/71 (BP Location: Left Arm)   Pulse 79   Temp 97.7 F (36.5 C) (Oral)   Resp 18   SpO2 96%  Physical Exam Vitals and nursing note reviewed.  Constitutional:      General: She is not in acute distress.    Appearance: She is well-developed.  HENT:     Head: Normocephalic and atraumatic.  Eyes:     Conjunctiva/sclera: Conjunctivae normal.  Cardiovascular:  Rate and Rhythm: Normal rate and regular rhythm.     Heart sounds: No murmur heard. Pulmonary:     Effort: Pulmonary effort is normal. No respiratory distress.     Breath sounds: Normal breath sounds. No wheezing or rales.  Musculoskeletal:        General: No swelling.     Cervical back: Neck supple.     Comments: Right shoulder with no significant tenderness, swelling, erythema, warmth, fluctuance.  Full active range of motion is limited due to pain.  She has 5 out of 5 upper extremity strength.  NVI.  Radial pulses symmetric.  Full elbow range of motion.  Skin:    General: Skin is warm and dry.     Capillary Refill: Capillary refill takes less than 2 seconds.  Neurological:     Mental Status: She is alert.   Psychiatric:        Mood and Affect: Mood normal.     ED Results / Procedures / Treatments   Labs (all labs ordered are listed, but only abnormal results are displayed) Labs Reviewed  BASIC METABOLIC PANEL - Abnormal; Notable for the following components:      Result Value   Glucose, Bld 100 (*)    All other components within normal limits  CBC  TROPONIN I (HIGH SENSITIVITY)    EKG None  Radiology DG Shoulder Right Result Date: 07/14/2023 CLINICAL DATA:  Right shoulder pain. EXAM: RIGHT SHOULDER - 2+ VIEW COMPARISON:  None Available. FINDINGS: There is diffuse osteopenia of the visualized osseous structures. No acute fracture or dislocation. No aggressive osseous lesion. Glenohumeral and acromioclavicular joints are normal in alignment and exhibit mild-to-moderate degenerative changes. No soft tissue swelling. No radiopaque foreign bodies. IMPRESSION: No acute osseous abnormality of the right shoulder joint. Electronically Signed   By: Jules Schick M.D.   On: 07/14/2023 16:06    Procedures Procedures    Medications Ordered in ED Medications - No data to display  ED Course/ Medical Decision Making/ A&P                                 Medical Decision Making Amount and/or Complexity of Data Reviewed Labs: ordered. Radiology: ordered.   This patient presents to the ED with chief complaint(s) of right shoulder pain.  The complaint involves an extensive differential diagnosis and also carries with it a high risk of complications and morbidity.   pertinent past medical history as listed in HPI  The differential diagnosis includes  Septic joint, gout, dislocation, fracture, strain/musculoskeletal, ACS, PE  Additional history obtained: Records reviewed previous admission documents and Care Everywhere/External Records  Initial Assessment:   Hemodynamically stable, afebrile, nontoxic-appearing patient presenting with right shoulder pain for the last month.  On exam she  has minimal tenderness.  She tolerates range of motion but lacks full forward flexion.  No significant tenderness.  Low suspicion for fracture or dislocation.  There is no overlying erythema, warmth or fluctuance.  No suspicion for infectious process, septic joint or gout.  No reported chest pain or shortness of breath.  No suspicion for ACS or PE.  Overall most suspicious for musculoskeletal etiology.  Independent ECG interpretation:  Sinus rhythm with LVH and poor R wave progression  Independent labs interpretation:  The following labs were independently interpreted:  Troponin without elevation, CBC unremarkable, BMP without significant abnormality  Independent visualization and interpretation of imaging: I independently visualized the following  imaging with scope of interpretation limited to determining acute life threatening conditions related to emergency care: Shoulder x-ray, which revealed no acute abnormality, mild to moderate arthritic changes  Treatment and Reassessment: No medications administered during visit  Consultations obtained:   none  Disposition:   Patient will be discharged home.  Patient provided referral to orthopedics.  The patient has been appropriately medically screened and/or stabilized in the ED. I have low suspicion for any other emergent medical condition which would require further screening, evaluation or treatment in the ED or require inpatient management. At time of discharge the patient is hemodynamically stable and in no acute distress. I have discussed work-up results and diagnosis with patient and answered all questions. Patient is agreeable with discharge plan. We discussed strict return precautions for returning to the emergency department and they verbalized understanding.     Social Determinants of Health:   none  This note was dictated with voice recognition software.  Despite best efforts at proofreading, errors may have occurred which can change  the documentation meaning.          Final Clinical Impression(s) / ED Diagnoses Final diagnoses:  Chronic right shoulder pain    Rx / DC Orders ED Discharge Orders     None         Fabienne Bruns 07/14/23 1737    Maia Plan, MD 07/15/23 1850
